# Patient Record
Sex: Female | Born: 1935 | Race: Black or African American | Hispanic: No | Smoking: Former smoker
Health system: Southern US, Community
[De-identification: ages and names within clinical notes are randomized; demographics above are authoritative.]

## PROBLEM LIST (undated history)

## (undated) DIAGNOSIS — E559 Vitamin D deficiency, unspecified: Secondary | ICD-10-CM

## (undated) DIAGNOSIS — N182 Chronic kidney disease, stage 2 (mild): Secondary | ICD-10-CM

## (undated) DIAGNOSIS — R413 Other amnesia: Secondary | ICD-10-CM

## (undated) DIAGNOSIS — E785 Hyperlipidemia, unspecified: Secondary | ICD-10-CM

## (undated) DIAGNOSIS — I10 Essential (primary) hypertension: Secondary | ICD-10-CM

## (undated) HISTORY — PX: BREAST EXCISIONAL BIOPSY: SUR124

## (undated) HISTORY — DX: Hyperlipidemia, unspecified: E78.5

## (undated) HISTORY — PX: BREAST SURGERY: SHX581

## (undated) HISTORY — DX: Chronic kidney disease, stage 2 (mild): N18.2

## (undated) HISTORY — PX: ANKLE FRACTURE SURGERY: SHX122

## (undated) HISTORY — DX: Vitamin D deficiency, unspecified: E55.9

## (undated) HISTORY — DX: Other amnesia: R41.3

## (undated) HISTORY — PX: KNEE SURGERY: SHX244

---

## 1998-04-11 ENCOUNTER — Emergency Department (HOSPITAL_COMMUNITY): Admission: EM | Admit: 1998-04-11 | Discharge: 1998-04-11 | Payer: Self-pay | Admitting: Emergency Medicine

## 1999-10-14 ENCOUNTER — Emergency Department (HOSPITAL_COMMUNITY): Admission: EM | Admit: 1999-10-14 | Discharge: 1999-10-14 | Payer: Self-pay | Admitting: *Deleted

## 2000-03-29 ENCOUNTER — Emergency Department (HOSPITAL_COMMUNITY): Admission: EM | Admit: 2000-03-29 | Discharge: 2000-03-30 | Payer: Self-pay | Admitting: Emergency Medicine

## 2000-03-31 ENCOUNTER — Encounter: Admission: RE | Admit: 2000-03-31 | Discharge: 2000-03-31 | Payer: Self-pay | Admitting: General Surgery

## 2000-03-31 ENCOUNTER — Encounter (HOSPITAL_BASED_OUTPATIENT_CLINIC_OR_DEPARTMENT_OTHER): Payer: Self-pay | Admitting: General Surgery

## 2002-04-05 ENCOUNTER — Encounter (HOSPITAL_BASED_OUTPATIENT_CLINIC_OR_DEPARTMENT_OTHER): Payer: Self-pay | Admitting: General Surgery

## 2002-04-05 ENCOUNTER — Encounter: Admission: RE | Admit: 2002-04-05 | Discharge: 2002-04-05 | Payer: Self-pay | Admitting: General Surgery

## 2003-04-26 ENCOUNTER — Encounter (HOSPITAL_BASED_OUTPATIENT_CLINIC_OR_DEPARTMENT_OTHER): Payer: Self-pay | Admitting: General Surgery

## 2003-04-26 ENCOUNTER — Encounter: Admission: RE | Admit: 2003-04-26 | Discharge: 2003-04-26 | Payer: Self-pay | Admitting: General Surgery

## 2004-06-20 ENCOUNTER — Encounter: Admission: RE | Admit: 2004-06-20 | Discharge: 2004-06-20 | Payer: Self-pay | Admitting: General Surgery

## 2008-11-05 ENCOUNTER — Inpatient Hospital Stay (HOSPITAL_COMMUNITY): Admission: EM | Admit: 2008-11-05 | Discharge: 2008-11-14 | Payer: Self-pay | Admitting: Emergency Medicine

## 2010-03-12 ENCOUNTER — Emergency Department (HOSPITAL_COMMUNITY): Admission: EM | Admit: 2010-03-12 | Discharge: 2010-03-12 | Payer: Self-pay | Admitting: Family Medicine

## 2010-03-14 ENCOUNTER — Emergency Department (HOSPITAL_COMMUNITY): Admission: EM | Admit: 2010-03-14 | Discharge: 2010-03-14 | Payer: Self-pay | Admitting: Family Medicine

## 2010-12-19 LAB — PROTIME-INR
INR: 1.2 (ref 0.00–1.49)
INR: 1.6 — ABNORMAL HIGH (ref 0.00–1.49)
INR: 1.9 — ABNORMAL HIGH (ref 0.00–1.49)
INR: 2.1 — ABNORMAL HIGH (ref 0.00–1.49)
INR: 2.2 — ABNORMAL HIGH (ref 0.00–1.49)
INR: 2.4 — ABNORMAL HIGH (ref 0.00–1.49)
Prothrombin Time: 15.2 seconds (ref 11.6–15.2)
Prothrombin Time: 19.9 seconds — ABNORMAL HIGH (ref 11.6–15.2)
Prothrombin Time: 22.4 seconds — ABNORMAL HIGH (ref 11.6–15.2)
Prothrombin Time: 25.1 seconds — ABNORMAL HIGH (ref 11.6–15.2)
Prothrombin Time: 30.2 seconds — ABNORMAL HIGH (ref 11.6–15.2)

## 2010-12-19 LAB — CBC
RBC: 4.36 MIL/uL (ref 3.87–5.11)
WBC: 6.9 10*3/uL (ref 4.0–10.5)

## 2010-12-19 LAB — HEMOGLOBIN AND HEMATOCRIT, BLOOD: Hemoglobin: 11.1 g/dL — ABNORMAL LOW (ref 12.0–15.0)

## 2010-12-24 LAB — SAMPLE TO BLOOD BANK

## 2010-12-24 LAB — POCT I-STAT, CHEM 8
Calcium, Ion: 1.09 mmol/L — ABNORMAL LOW (ref 1.12–1.32)
Chloride: 111 mEq/L (ref 96–112)
HCT: 46 % (ref 36.0–46.0)
Hemoglobin: 15.6 g/dL — ABNORMAL HIGH (ref 12.0–15.0)

## 2010-12-24 LAB — PROTIME-INR: INR: 1.1 (ref 0.00–1.49)

## 2010-12-24 LAB — CBC
HCT: 42.8 % (ref 36.0–46.0)
Hemoglobin: 14.5 g/dL (ref 12.0–15.0)
Platelets: 186 10*3/uL (ref 150–400)
WBC: 6.3 10*3/uL (ref 4.0–10.5)

## 2010-12-26 ENCOUNTER — Ambulatory Visit (INDEPENDENT_AMBULATORY_CARE_PROVIDER_SITE_OTHER): Payer: PRIVATE HEALTH INSURANCE

## 2010-12-26 ENCOUNTER — Inpatient Hospital Stay (INDEPENDENT_AMBULATORY_CARE_PROVIDER_SITE_OTHER)
Admission: RE | Admit: 2010-12-26 | Discharge: 2010-12-26 | Disposition: A | Discharge status: Home / Self Care | Payer: Medicare Other | Source: Ambulatory Visit | Attending: Family Medicine | Admitting: Family Medicine

## 2010-12-26 DIAGNOSIS — I10 Essential (primary) hypertension: Secondary | ICD-10-CM

## 2010-12-26 DIAGNOSIS — J189 Pneumonia, unspecified organism: Secondary | ICD-10-CM

## 2010-12-26 DIAGNOSIS — H6120 Impacted cerumen, unspecified ear: Secondary | ICD-10-CM

## 2010-12-26 LAB — POCT I-STAT, CHEM 8
HCT: 39 % (ref 36.0–46.0)
Hemoglobin: 13.3 g/dL (ref 12.0–15.0)
Potassium: 3.9 mEq/L (ref 3.5–5.1)
Sodium: 142 mEq/L (ref 135–145)

## 2010-12-26 LAB — POCT URINALYSIS DIP (DEVICE)
Glucose, UA: NEGATIVE mg/dL
Nitrite: NEGATIVE
Specific Gravity, Urine: 1.015 (ref 1.005–1.030)
Urobilinogen, UA: 2 mg/dL — ABNORMAL HIGH (ref 0.0–1.0)

## 2010-12-30 ENCOUNTER — Inpatient Hospital Stay (INDEPENDENT_AMBULATORY_CARE_PROVIDER_SITE_OTHER)
Admission: RE | Admit: 2010-12-30 | Discharge: 2010-12-30 | Disposition: A | Discharge status: Home / Self Care | Payer: PRIVATE HEALTH INSURANCE | Source: Ambulatory Visit | Attending: Family Medicine | Admitting: Family Medicine

## 2010-12-30 DIAGNOSIS — H6120 Impacted cerumen, unspecified ear: Secondary | ICD-10-CM

## 2010-12-30 DIAGNOSIS — J189 Pneumonia, unspecified organism: Secondary | ICD-10-CM
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
## 2012-09-08 DIAGNOSIS — J189 Pneumonia, unspecified organism: Secondary | ICD-10-CM

## 2012-09-08 HISTORY — DX: Pneumonia, unspecified organism: J18.9

## 2016-09-08 DIAGNOSIS — D249 Benign neoplasm of unspecified breast: Secondary | ICD-10-CM

## 2016-09-08 HISTORY — DX: Benign neoplasm of unspecified breast: D24.9

## 2016-09-08 HISTORY — PX: BREAST SURGERY: SHX581

## 2017-03-28 ENCOUNTER — Emergency Department (HOSPITAL_COMMUNITY)
Admission: EM | Admit: 2017-03-28 | Discharge: 2017-03-28 | Disposition: A | Discharge status: Home / Self Care | Payer: Medicare Other | Attending: Emergency Medicine | Admitting: Emergency Medicine

## 2017-03-28 ENCOUNTER — Encounter (HOSPITAL_COMMUNITY): Payer: Self-pay | Admitting: Emergency Medicine

## 2017-03-28 DIAGNOSIS — N644 Mastodynia: Secondary | ICD-10-CM | POA: Diagnosis not present

## 2017-03-28 MED ORDER — CEPHALEXIN 500 MG PO CAPS: 500.0000 mg | ORAL_CAPSULE | Freq: Four times a day (QID) | ORAL | 0 refills | Status: DC

## 2017-03-28 MED ORDER — HYDROCODONE-ACETAMINOPHEN 5-325 MG PO TABS: 1.0000 | ORAL_TABLET | ORAL | 0 refills | Status: DC | PRN

## 2017-03-28 MED ORDER — IBUPROFEN 200 MG PO TABS
400.0000 mg | ORAL_TABLET | Freq: Once | ORAL | Status: AC
  Administered 2017-03-28: 400 mg via ORAL
  Filled 2017-03-28: qty 2

## 2017-03-28 MED ORDER — OXYCODONE-ACETAMINOPHEN 5-325 MG PO TABS
1.0000 | ORAL_TABLET | Freq: Once | ORAL | Status: AC
  Administered 2017-03-28: 1 via ORAL
  Filled 2017-03-28: qty 1

## 2017-03-28 MED ORDER — CEPHALEXIN 500 MG PO CAPS
500.0000 mg | ORAL_CAPSULE | Freq: Once | ORAL | Status: AC
  Administered 2017-03-28: 500 mg via ORAL
  Filled 2017-03-28: qty 1

## 2017-03-28 NOTE — ED Triage Notes (Signed)
Pt from home with complaints of left breast swelling and pain that began around 1600 today. Pt states she had the same thing happen "many" years ago and they ended up doing a biopsy in her PCP's office.

## 2017-03-28 NOTE — ED Provider Notes (Signed)
[facility] DEPT Provider Note   CSN: [id number] Arrival date & time: 03/28/17  1947   By signing my name below, I, Soijett Blue, attest that this documentation has been prepared under the direction and in the presence of Virgel Manifold, MD. Electronically Signed: [place], ED Scribe. 03/28/17. 8:27 PM.  History   Chief Complaint Chief Complaint  Patient presents with  . Breast Pain    HPI Abigail Shannon is a 81 y.o. female who presents to the Emergency Department complaining of left breast pain onset 4 PM today. Pt reports associated swelling localized to left breast. Pt has not tried any medications for the relief of her symptoms. She notes that she has a hx of mastitis to her left breast s/p breastfeeding her daughter where the affected area was biopsied by her PCP. Pt states that she is unsure of when the left breast was biopsied. Pt reports that she is unsure of the last time that she had a mammogram. She denies rash, redness, fever, chills, nipple discharge, and any other symptoms. Denies allergies to medications.  The history is provided by the patient. No language interpreter was used.    History reviewed. No pertinent past medical history.  There are no active problems to display for this patient.   Past Surgical History:  Procedure Laterality Date  . BREAST SURGERY    . KNEE SURGERY      OB History    No data available       Home Medications    Prior to Admission medications   Not on File    Family History No family history on file.  Social History Social History  Substance Use Topics  . Smoking status: Never Smoker  . Smokeless tobacco: Never Used  . Alcohol use No     Allergies   Aspirin   Review of Systems Review of Systems  Constitutional: Negative for chills and fever.  Respiratory:       +left breast pain and swelling  Skin: Negative for color change and rash.     Physical Exam Updated Vital Signs BP (!) 208/86 (BP  Location: Right Arm) Comment: Pt's bp is usually under control with medications  Pulse 62   Temp 97.7 F (36.5 C) (Oral)   Resp 16   Ht 5\' 4"  (1.626 m)   Wt 150 lb (68 kg)   SpO2 100%   BMI 25.75 kg/m   Physical Exam  Constitutional: She is oriented to person, place, and time. She appears well-developed and well-nourished. No distress.  HENT:  Head: Normocephalic and atraumatic.  Eyes: EOM are normal.  Neck: Neck supple.  Cardiovascular: Normal rate, regular rhythm and normal heart sounds.  Exam reveals no gallop and no friction rub.   No murmur heard. Pulmonary/Chest: Effort normal and breath sounds normal. No respiratory distress. She has no wheezes. She has no rales. Left breast exhibits tenderness. Left breast exhibits no inverted nipple and no nipple discharge.  Scribe chaperone present for exam. Left breast with TTP. Faint erythema to the upper inner quadrant of the left breast. Small, fibrous, area subcutaneously. No fluctuance. No skin thickening. Nipple appears normal. No retraction or discharge. No axillary adenopathy.  Abdominal: Soft. She exhibits no distension. There is no tenderness.  Musculoskeletal: Normal range of motion.  Lymphadenopathy:    She has no axillary adenopathy.  Neurological: She is alert and oriented to person, place, and time.  Skin: Skin is warm and dry.  Psychiatric: She has a normal  mood and affect. Her behavior is normal.  Nursing note and vitals reviewed.   ED Treatments / Results  DIAGNOSTIC STUDIES: Oxygen Saturation is 100% on RA, nl by my interpretation.    COORDINATION OF CARE: 8:24 PM Discussed treatment plan with pt at bedside and pt agreed to plan.   Labs (all labs ordered are listed, but only abnormal results are displayed) Labs Reviewed - No data to display  EKG  EKG Interpretation None       Radiology No results found.  Procedures Procedures (including critical care time)  Medications Ordered in ED Medications -  No data to display   Initial Impression / Assessment and Plan / ED Course  I have reviewed the triage vital signs and the nursing notes.  Pertinent labs & imaging results that were available during my care of the patient were reviewed by me and considered in my medical decision making (see chart for details).     81yF with breast pain. Small mass versus abscess. Nontoxic. No overlying skin changes. Keflex. Needs further imaging. Recommended FU at [facility].   Final Clinical Impressions(s) / ED Diagnoses   Final diagnoses:  Breast pain, left    New Prescriptions New Prescriptions   No medications on file   I personally preformed the services scribed in my presence. The recorded information has been reviewed is accurate. Virgel Manifold, MD.     Virgel Manifold, MD 04/12/17 Natasha Mead

## 2017-03-28 NOTE — Discharge Instructions (Signed)
I am concerned you are developing an infection in your breast. Take antibiotics as prescribed and pain medication as needed. You need to follow-up at the [facility] for imaging.

## 2017-03-31 ENCOUNTER — Emergency Department (HOSPITAL_COMMUNITY)
Admission: EM | Admit: 2017-03-31 | Discharge: 2017-03-31 | Disposition: A | Discharge status: Home / Self Care | Payer: Medicare Other | Attending: Emergency Medicine | Admitting: Emergency Medicine

## 2017-03-31 ENCOUNTER — Encounter (HOSPITAL_COMMUNITY): Payer: Self-pay | Admitting: Emergency Medicine

## 2017-03-31 DIAGNOSIS — N6342 Unspecified lump in left breast, subareolar: Secondary | ICD-10-CM | POA: Diagnosis not present

## 2017-03-31 DIAGNOSIS — N6320 Unspecified lump in the left breast, unspecified quadrant: Secondary | ICD-10-CM | POA: Diagnosis present

## 2017-03-31 DIAGNOSIS — N63 Unspecified lump in unspecified breast: Secondary | ICD-10-CM

## 2017-03-31 DIAGNOSIS — I10 Essential (primary) hypertension: Secondary | ICD-10-CM | POA: Insufficient documentation

## 2017-03-31 DIAGNOSIS — R51 Headache: Secondary | ICD-10-CM | POA: Diagnosis not present

## 2017-03-31 LAB — CBC WITH DIFFERENTIAL/PLATELET
Basophils Absolute: 0 10*3/uL (ref 0.0–0.1)
Basophils Relative: 1 %
EOS PCT: 3 %
Eosinophils Absolute: 0.2 10*3/uL (ref 0.0–0.7)
HCT: 36.7 % (ref 36.0–46.0)
Hemoglobin: 12.3 g/dL (ref 12.0–15.0)
LYMPHS ABS: 2.4 10*3/uL (ref 0.7–4.0)
LYMPHS PCT: 41 %
MCH: 28.3 pg (ref 26.0–34.0)
MCHC: 33.5 g/dL (ref 30.0–36.0)
MCV: 84.4 fL (ref 78.0–100.0)
MONO ABS: 0.6 10*3/uL (ref 0.1–1.0)
Monocytes Relative: 9 %
Neutro Abs: 2.8 10*3/uL (ref 1.7–7.7)
Neutrophils Relative %: 46 %
PLATELETS: 164 10*3/uL (ref 150–400)
RBC: 4.35 MIL/uL (ref 3.87–5.11)
RDW: 15.2 % (ref 11.5–15.5)
WBC: 6 10*3/uL (ref 4.0–10.5)

## 2017-03-31 LAB — COMPREHENSIVE METABOLIC PANEL
ALT: 10 U/L — ABNORMAL LOW (ref 14–54)
AST: 18 U/L (ref 15–41)
Albumin: 3.9 g/dL (ref 3.5–5.0)
Alkaline Phosphatase: 74 U/L (ref 38–126)
Anion gap: 9 (ref 5–15)
BUN: 17 mg/dL (ref 6–20)
CHLORIDE: 107 mmol/L (ref 101–111)
CO2: 26 mmol/L (ref 22–32)
CREATININE: 1.21 mg/dL — AB (ref 0.44–1.00)
Calcium: 8.9 mg/dL (ref 8.9–10.3)
GFR, EST AFRICAN AMERICAN: 48 mL/min — AB (ref >60)
GFR, EST NON AFRICAN AMERICAN: 41 mL/min — AB (ref >60)
Glucose, Bld: 95 mg/dL (ref 65–99)
POTASSIUM: 4.2 mmol/L (ref 3.5–5.1)
Sodium: 142 mmol/L (ref 135–145)
Total Bilirubin: 0.6 mg/dL (ref 0.3–1.2)
Total Protein: 7.6 g/dL (ref 6.5–8.1)

## 2017-03-31 MED ORDER — HYDRALAZINE HCL 20 MG/ML IJ SOLN
10.0000 mg | Freq: Once | INTRAMUSCULAR | Status: AC
  Administered 2017-03-31: 10 mg via INTRAVENOUS
  Filled 2017-03-31: qty 0.5

## 2017-03-31 MED ORDER — AMLODIPINE BESYLATE 5 MG PO TABS
10.0000 mg | ORAL_TABLET | Freq: Once | ORAL | Status: AC
  Administered 2017-03-31: 10 mg via ORAL
  Filled 2017-03-31: qty 2

## 2017-03-31 MED ORDER — AMLODIPINE BESYLATE 10 MG PO TABS: 10.0000 mg | ORAL_TABLET | Freq: Every day | ORAL | 0 refills | Status: DC

## 2017-03-31 MED ORDER — DOXYCYCLINE HYCLATE 100 MG PO CAPS: 100.0000 mg | ORAL_CAPSULE | Freq: Two times a day (BID) | ORAL | 0 refills | Status: AC

## 2017-03-31 MED ORDER — LOSARTAN POTASSIUM 25 MG PO TABS
25.0000 mg | ORAL_TABLET | Freq: Once | ORAL | Status: AC
  Administered 2017-03-31: 25 mg via ORAL
  Filled 2017-03-31: qty 1

## 2017-03-31 NOTE — ED Provider Notes (Signed)
[facility] DEPT Provider Note   CSN: [id number] Arrival date & time: 03/31/17  1452     History   Chief Complaint Chief Complaint  Patient presents with  . Breast Mass    left     HPI Abigail Shannon is a 81 y.o. female.  HPI   Presents with left breast pain and since Saturday> Was seen in the ED, started on keflex but has not had relief.  Has been getting worse since Saturday. Then reported pain in breast was causing headache.  Has been taking blood pressure medication as prescribed. BP elevated in ED. Denies chest pain, dyspnea, numbness/weakness, facial droop, trouble talking or walking.  Did have headache earlier but relieved now.  Pain in breast was 12/10, no drainage.   History of breast mass, but no history of abscess or skin infection. This feels similar to when had breast surgery 30yr ago.   History reviewed. No pertinent past medical history.  There are no active problems to display for this patient.   Past Surgical History:  Procedure Laterality Date  . BREAST SURGERY    . KNEE SURGERY      OB History    No data available       Home Medications    Prior to Admission medications   Medication Sig Start Date End Date Taking? Authorizing Provider  cephALEXin (KEFLEX) 500 MG capsule Take 1 capsule (500 mg total) by mouth 4 (four) times daily. 03/28/17  Yes Virgel Manifold, MD  HYDROcodone-acetaminophen (NORCO/VICODIN) 5-325 MG tablet Take 1 tablet by mouth every 4 (four) hours as needed. 03/28/17  Yes Virgel Manifold, MD  losartan (COZAAR) 100 MG tablet Take 100 mg by mouth daily.   Yes [provider]  naproxen sodium (ANAPROX) 220 MG tablet Take 220 mg by mouth every 8 (eight) hours as needed (knee pain).   Yes [provider]  amLODipine (NORVASC) 10 MG tablet Take 1 tablet (10 mg total) by mouth daily. 03/31/17   Gareth Morgan, MD  doxycycline (VIBRAMYCIN) 100 MG capsule Take 1 capsule (100 mg total) by mouth 2 (two) times daily.  03/31/17 04/07/17  Gareth Morgan, MD    Family History No family history on file.  Social History Social History  Substance Use Topics  . Smoking status: Never Smoker  . Smokeless tobacco: Never Used  . Alcohol use No     Allergies   Aspirin   Review of Systems Review of Systems  Constitutional: Negative for fever.  HENT: Negative for sore throat.   Eyes: Negative for visual disturbance.  Respiratory: Negative for cough and shortness of breath.   Cardiovascular: Positive for chest pain (breast pain).  Gastrointestinal: Negative for abdominal pain, nausea and vomiting.  Genitourinary: Negative for difficulty urinating.  Musculoskeletal: Negative for back pain and neck pain.  Skin: Negative for rash.  Neurological: Positive for headaches. Negative for syncope, facial asymmetry, speech difficulty, weakness and numbness.     Physical Exam Updated Vital Signs BP (!) 172/77   Pulse 79   Temp 98.5 F (36.9 C) (Oral)   Resp 18   Ht 5\' 4"  (1.626 m)   Wt 68 kg (150 lb)   SpO2 100%   BMI 25.75 kg/m   Physical Exam  Constitutional: She is oriented to person, place, and time. She appears well-developed and well-nourished. No distress.  HENT:  Head: Normocephalic.  Eyes: Pupils are equal, round, and reactive to light. Conjunctivae and EOM are normal.  Neck: Normal range of motion.  Cardiovascular: Normal rate, regular rhythm, normal heart sounds and intact distal pulses.  Exam reveals no gallop and no friction rub.   No murmur heard. Pulmonary/Chest: Effort normal and breath sounds normal. No respiratory distress. She has no wheezes. She has no rales.  Left breast with tender mass measuring approximately 3cm on palpation under areola. No erythema. No drainage.  Abdominal: Soft. She exhibits no distension. There is no tenderness. There is no guarding.  Musculoskeletal: She exhibits no edema or tenderness.  Neurological: She is alert and oriented to person, place, and  time.  Skin: Skin is warm and dry. No rash noted. She is not diaphoretic. No erythema.  Nursing note and vitals reviewed.    ED Treatments / Results  Labs (all labs ordered are listed, but only abnormal results are displayed) Labs Reviewed  COMPREHENSIVE METABOLIC PANEL - Abnormal; Notable for the following:       Result Value   Creatinine, Ser 1.21 (*)    ALT 10 (*)    GFR calc non Af Amer 41 (*)    GFR calc Af Amer 48 (*)    All other components within normal limits  CBC WITH DIFFERENTIAL/PLATELET    EKG  EKG Interpretation None       Radiology No results found.  Procedures Procedures (including critical care time)  Medications Ordered in ED Medications  hydrALAZINE (APRESOLINE) injection 10 mg (10 mg Intravenous Given 03/31/17 1923)  losartan (COZAAR) tablet 25 mg (25 mg Oral Given 03/31/17 1928)  amLODipine (NORVASC) tablet 10 mg (10 mg Oral Given 03/31/17 2018)  hydrALAZINE (APRESOLINE) injection 10 mg (10 mg Intravenous Given 03/31/17 2017)     Initial Impression / Assessment and Plan / ED Course  I have reviewed the triage vital signs and the nursing notes.  Pertinent labs & imaging results that were available during my care of the patient were reviewed by me and considered in my medical decision making (see chart for details).     81 year old female with a history of hypertension, prior breast surgery, presents with concern for breast mass. Patient had been evaluated on Saturday for the same, started on Keflex, however has had increasing symptoms. Patient noted to be hypertensive on arrival to the emergency department to 240s over 120s.  Patient without acute headache, no neurologic symptoms, no chest pain, no shortness of breath and have low suspicion for hypertensive emergencies including low suspicion for [facility] hypertensive encephalopathy, stroke, MI, aortic dissection, pulmonary edema.  BMP was checked showing renal function with cr of 1.2 from 1 in 2012.  Provided patient with dose of losartan and given hydralazine x2, then amlodipine.  Blood pressures improved to 761Y systolic. Discussed importance of close primary care follow up and reasons to return to the ED in detail.  For breast mass, unclear if abscess, area is tender, however not erythematous, no leukocytosis, will change abx to doxycycline given worsening pain for MRSA coverage, however patient stable, not clearly an abscess, do not feel inpatient admission or emergent drainage.  Recommend Follow-up with breast care center as well as primary care physician for hypertension.  Patient discharged in stable condition with understanding of reasons to return.    Final Clinical Impressions(s) / ED Diagnoses   Final diagnoses:  Breast mass  Essential hypertension    New Prescriptions Discharge Medication List as of 03/31/2017  9:38 PM    START taking these medications   Details  amLODipine (NORVASC) 10 MG tablet Take 1 tablet (10 mg total) by  mouth daily., Starting Tue 03/31/2017, Print         Gareth Morgan, MD 04/01/17 [phone]

## 2017-03-31 NOTE — ED Triage Notes (Signed)
Pt here with c/o left breast swelling and pain that she rates 10/10. Pt was recently seen for the same and states symptoms have gotten worse. Pt is hypertensive at time of assessment, pt denies lightheadedness but states she has a headache. Pt denies n/v.

## 2017-03-31 NOTE — ED Notes (Signed)
Pt called by Larene Pickett, EMT and this writer without response from lobby.

## 2017-03-31 NOTE — ED Notes (Signed)
Bed: [facility] Expected date:  Expected time:  Means of arrival:  Comments: 

## 2017-04-02 ENCOUNTER — Other Ambulatory Visit: Payer: Self-pay | Admitting: Internal Medicine

## 2017-04-02 DIAGNOSIS — N63 Unspecified lump in unspecified breast: Secondary | ICD-10-CM

## 2017-04-07 ENCOUNTER — Ambulatory Visit
Admission: RE | Admit: 2017-04-07 | Discharge: 2017-04-07 | Disposition: A | Discharge status: Home / Self Care | Payer: Medicare Other | Source: Ambulatory Visit | Attending: Internal Medicine | Admitting: Internal Medicine

## 2017-04-07 ENCOUNTER — Other Ambulatory Visit: Payer: Self-pay | Admitting: Internal Medicine

## 2017-04-07 DIAGNOSIS — N63 Unspecified lump in unspecified breast: Secondary | ICD-10-CM

## 2017-04-10 ENCOUNTER — Ambulatory Visit
Admission: RE | Admit: 2017-04-10 | Discharge: 2017-04-10 | Disposition: A | Discharge status: Home / Self Care | Payer: Medicare Other | Source: Ambulatory Visit | Attending: Internal Medicine | Admitting: Internal Medicine

## 2017-04-10 ENCOUNTER — Other Ambulatory Visit: Payer: Self-pay | Admitting: Internal Medicine

## 2017-04-10 DIAGNOSIS — N63 Unspecified lump in unspecified breast: Secondary | ICD-10-CM

## 2017-04-10 DIAGNOSIS — R928 Other abnormal and inconclusive findings on diagnostic imaging of breast: Secondary | ICD-10-CM

## 2017-04-18 ENCOUNTER — Encounter (HOSPITAL_COMMUNITY): Payer: Self-pay | Admitting: Emergency Medicine

## 2017-04-18 ENCOUNTER — Emergency Department (HOSPITAL_COMMUNITY): Payer: Medicare Other

## 2017-04-18 ENCOUNTER — Emergency Department (HOSPITAL_COMMUNITY)
Admission: EM | Admit: 2017-04-18 | Discharge: 2017-04-18 | Disposition: A | Discharge status: Home / Self Care | Payer: Medicare Other | Attending: Emergency Medicine | Admitting: Emergency Medicine

## 2017-04-18 DIAGNOSIS — I10 Essential (primary) hypertension: Secondary | ICD-10-CM | POA: Diagnosis not present

## 2017-04-18 DIAGNOSIS — Y998 Other external cause status: Secondary | ICD-10-CM | POA: Diagnosis not present

## 2017-04-18 DIAGNOSIS — Z79899 Other long term (current) drug therapy: Secondary | ICD-10-CM | POA: Insufficient documentation

## 2017-04-18 DIAGNOSIS — W1840XA Slipping, tripping and stumbling without falling, unspecified, initial encounter: Secondary | ICD-10-CM | POA: Insufficient documentation

## 2017-04-18 DIAGNOSIS — Z23 Encounter for immunization: Secondary | ICD-10-CM | POA: Diagnosis not present

## 2017-04-18 DIAGNOSIS — Y92830 Public park as the place of occurrence of the external cause: Secondary | ICD-10-CM | POA: Diagnosis not present

## 2017-04-18 DIAGNOSIS — S0181XA Laceration without foreign body of other part of head, initial encounter: Secondary | ICD-10-CM | POA: Diagnosis present

## 2017-04-18 DIAGNOSIS — W19XXXA Unspecified fall, initial encounter: Secondary | ICD-10-CM

## 2017-04-18 DIAGNOSIS — Y9301 Activity, walking, marching and hiking: Secondary | ICD-10-CM | POA: Insufficient documentation

## 2017-04-18 HISTORY — DX: Essential (primary) hypertension: I10

## 2017-04-18 MED ORDER — TETANUS-DIPHTH-ACELL PERTUSSIS 5-2.5-18.5 LF-MCG/0.5 IM SUSP
0.5000 mL | Freq: Once | INTRAMUSCULAR | Status: AC
  Administered 2017-04-18: 0.5 mL via INTRAMUSCULAR
  Filled 2017-04-18: qty 0.5

## 2017-04-18 MED ORDER — LIDOCAINE-EPINEPHRINE (PF) 2 %-1:200000 IJ SOLN
10.0000 mL | Freq: Once | INTRAMUSCULAR | Status: AC
  Administered 2017-04-18: 10 mL
  Filled 2017-04-18: qty 20

## 2017-04-18 NOTE — ED Triage Notes (Signed)
Pt [place] laceration [place]. Denies LOC, A&O x 4.

## 2017-04-18 NOTE — ED Notes (Signed)
ED Provider at bedside giving discharge instructions. Pt voiced understanding of discharge instructions.

## 2017-04-18 NOTE — ED Notes (Signed)
EDP aware pt is hypertensive. Pt is asymptomatic. Pt given instruction by MD to take BP meds at home.

## 2017-04-18 NOTE — ED Provider Notes (Signed)
[facility] DEPT Provider Note   CSN: [id number] Arrival date & time: 04/18/17  1515     History   Chief Complaint Chief Complaint  Patient presents with  . Laceration    HPI Abigail Shannon is a 81 y.o. female with a history of HTN who presents after a Mechanical fall in the park causing her to strike her head on the ground. She reportedly was getting water from a cooler and tripped which caused her to fall. She broke her glasses when she fell. Her family members, who witnessed the fall, deny any loss of consciousness.  She denies any neck pain, headache, blurry/double vision, no extremity pain.  She was well prior to the fall.  She is unsure when her last tetanus shot was.  She is complaining of a cut to the left side of her face lateral to the eye.  She denies any current chest pain, shortness of breath, abdominal pain, injuries other than the cuts by her eye.   HPI  Past Medical History:  Diagnosis Date  . Hypertension     There are no active problems to display for this patient.   Past Surgical History:  Procedure Laterality Date  . BREAST EXCISIONAL BIOPSY Left    2 benign breast biopsies years ago  . BREAST SURGERY    . KNEE SURGERY      OB History    No data available       Home Medications    Prior to Admission medications   Medication Sig Start Date End Date Taking? Authorizing Provider  amLODipine (NORVASC) 10 MG tablet Take 1 tablet (10 mg total) by mouth daily. 03/31/17   Gareth Morgan, MD  cephALEXin (KEFLEX) 500 MG capsule Take 1 capsule (500 mg total) by mouth 4 (four) times daily. 03/28/17   Virgel Manifold, MD  HYDROcodone-acetaminophen (NORCO/VICODIN) 5-325 MG tablet Take 1 tablet by mouth every 4 (four) hours as needed. 03/28/17   Virgel Manifold, MD  losartan (COZAAR) 100 MG tablet Take 100 mg by mouth daily.    [provider]  naproxen sodium (ANAPROX) 220 MG tablet Take 220 mg by mouth every 8 (eight) hours as needed (knee pain).     [provider]    Family History No family history on file.  Social History Social History  Substance Use Topics  . Smoking status: Never Smoker  . Smokeless tobacco: Never Used  . Alcohol use No     Allergies   Aspirin   Review of Systems Review of Systems  Constitutional: Negative for chills and fever.  HENT: Negative for ear pain and sore throat.   Eyes: Negative for pain and visual disturbance.  Respiratory: Negative for cough and shortness of breath.   Cardiovascular: Negative for chest pain and palpitations.  Gastrointestinal: Negative for abdominal pain and vomiting.  Genitourinary: Negative for dysuria and hematuria.  Musculoskeletal: Negative for arthralgias, back pain, gait problem, neck pain and neck stiffness.  Skin: Positive for wound. Negative for color change and rash.  Neurological: Negative for dizziness, seizures, syncope, weakness, light-headedness and headaches.  All other systems reviewed and are negative.    Physical Exam Updated Vital Signs BP (!) 197/92   Pulse 65   Temp 98.2 F (36.8 C) (Oral)   Resp 18   Ht 5\' 4"  (1.626 m)   Wt 63.5 kg (140 lb)   SpO2 98%   BMI 24.03 kg/m   Physical Exam  Constitutional: She is oriented to person, place, and  time. She appears well-developed and well-nourished. No distress.  HENT:  Head: Normocephalic and atraumatic.  Mouth/Throat: Oropharynx is clear and moist.  Eyes: Conjunctivae are normal.  Neck: Normal range of motion. Neck supple.  Cardiovascular: Normal rate, regular rhythm, normal heart sounds and intact distal pulses.   No murmur heard. Pulmonary/Chest: Effort normal and breath sounds normal. No respiratory distress.  Abdominal: Soft. She exhibits no distension. There is no tenderness.  Musculoskeletal: She exhibits no edema.  Neurological: She is alert and oriented to person, place, and time.  Mental Status:  Alert, oriented, thought content appropriate, able to give a coherent  history. Speech fluent without evidence of aphasia. Able to follow 2 step commands without difficulty.  Cranial Nerves:  II:  Peripheral visual fields grossly normal, pupils equal, round, reactive to light III,IV, VI: ptosis not present, extra-ocular motions intact bilaterally  V,VII: smile symmetric, facial light touch sensation equal VIII: hearing grossly normal to voice  X: uvula elevates symmetrically  XI: bilateral shoulder shrug symmetric and strong XII: midline tongue extension without fassiculations Motor:  Normal tone. 5/5 in upper and lower extremities bilaterally including strong and equal grip strength and dorsiflexion/plantar flexion CV: distal pulses palpable throughout    Skin: Skin is warm and dry.  1cm superficial laceration, not through dermis, lateral to left eye.  1cm lateral there is a second superficial stellate laceration with a under 36mm area that is into the subcutaneous tissue.    Psychiatric: She has a normal mood and affect. Her behavior is normal.  Nursing note and vitals reviewed.    ED Treatments / Results  Labs (all labs ordered are listed, but only abnormal results are displayed) Labs Reviewed - No data to display  EKG  EKG Interpretation None       Radiology Ct Head Wo Contrast  Result Date: 04/18/2017 CLINICAL DATA:  Head trauma.  Fall EXAM: CT HEAD WITHOUT CONTRAST TECHNIQUE: Contiguous axial images were obtained from the base of the skull through the vertex without intravenous contrast. COMPARISON:  11/09/2008 FINDINGS: Brain: Mild atrophy. Negative for hydrocephalus. Negative for acute infarct, hemorrhage, or mass lesion. No shift of the midline structures Vascular: Negative for hyperdense vessel Skull: Negative for skull fracture Sinuses/Orbits: Negative Other: None IMPRESSION: No acute abnormality.  Mild atrophy. Electronically Signed   By: Franchot Gallo M.D.   On: 04/18/2017 16:54    Procedures .Marland KitchenLaceration Repair Date/Time: 04/18/2017  8:00 PM Performed by: Lorin Glass Authorized by: Lorin Glass   Consent:    Consent obtained:  Verbal   Consent given by:  Patient   Risks discussed:  Pain, infection, need for additional repair, poor cosmetic result, retained foreign body, tendon damage, vascular damage and nerve damage   Alternatives discussed:  No treatment (Steri-strip, tissue adhesive) Anesthesia (see MAR for exact dosages):    Anesthesia method:  Local infiltration   Local anesthetic:  Lidocaine 2% WITH epi Laceration details:    Location:  Face (Stellate)   Face location:  L cheek   Length (cm):  1 Repair type:    Repair type:  Simple Pre-procedure details:    Preparation:  Patient was prepped and draped in usual sterile fashion and imaging obtained to evaluate for foreign bodies Exploration:    Hemostasis achieved with:  Direct pressure and epinephrine   Wound exploration: wound explored through full range of motion and entire depth of wound probed and visualized   Treatment:    Area cleansed with:  Saline (Chlorhexidine)  Amount of cleaning:  Standard   Irrigation solution:  Sterile saline   Irrigation method: Angiocath and syringe. Skin repair:    Repair method:  Sutures   Suture size:  5-0   Suture material:  Prolene   Suture technique:  Horizontal mattress   Number of sutures:  1 Approximation:    Approximation:  Close Post-procedure details:    Dressing:  Open (no dressing)   Patient tolerance of procedure:  Tolerated well, no immediate complications  .Marland KitchenLaceration Repair Date/Time: 04/18/2017 8:05 PM Performed by: Lorin Glass Authorized by: Lorin Glass   Consent:    Consent obtained:  Verbal   Consent given by:  Patient   Risks discussed:  Infection, pain and poor cosmetic result   Alternatives discussed:  No treatment Anesthesia (see MAR for exact dosages):    Anesthesia method:  Local infiltration   Local anesthetic:  Lidocaine 2% WITH  epi Laceration details:    Location:  Face   Facial location: Left face lateral to eye.   Length (cm):  1 Repair type:    Repair type:  Simple Pre-procedure details:    Preparation:  Patient was prepped and draped in usual sterile fashion and imaging obtained to evaluate for foreign bodies Exploration:    Wound exploration: entire depth of wound probed and visualized   Treatment:    Area cleansed with:  Saline (chlorhexadine)   Amount of cleaning:  Standard   Irrigation solution:  Sterile saline Skin repair:    Repair method:  Tissue adhesive Approximation:    Approximation:  Close Post-procedure details:    Dressing:  Open (no dressing)   Patient tolerance of procedure:  Tolerated well, no immediate complications    Medications Ordered in ED Medications  lidocaine-EPINEPHrine (XYLOCAINE W/EPI) 2 %-1:200000 (PF) injection 10 mL (10 mLs Infiltration Given by Other 04/18/17 1756)  Tdap (BOOSTRIX) injection 0.5 mL (0.5 mLs Intramuscular Given 04/18/17 1809)     Initial Impression / Assessment and Plan / ED Course  I have reviewed the triage vital signs and the nursing notes.  Pertinent labs & imaging results that were available during my care of the patient were reviewed by me and considered in my medical decision making (see chart for details).    Pressure irrigation performed. Wound explored and base of wound visualized in a bloodless field without evidence of foreign body.  Laceration occurred < 8 hours prior to repair which was well tolerated. Tdap updated. Pt discharged  without antibiotics.  Based on age CT head was obtained which did not show any acute cranial process or retained foreign bodies.   Normal neuro exam neck with full active pain-free range of motion without tenderness step-offs or deformities. Discussed suture home care and tissue adhesive care with patient and answered questions. Pt to follow-up for wound check and suture removal in 7 days; they are to return to  the ED or PCP sooner for signs of infection. Pt is hemodynamically stable with no complaints prior to dc. Her blood pressure was noted to be elevated, she was instructed to follow-up with her primary care doctor regarding this. Chart review shows that on previous ED visits her blood pressure has also been elevated. She is asymptomatic and this does not appear related to her ED visit today.  The patient was seen by Dr. Ralene Bathe who evaluated the patient and agreed with my plan.   Final Clinical Impressions(s) / ED Diagnoses   Final diagnoses:  Laceration of forehead, initial encounter  Fall, initial encounter    New Prescriptions Discharge Medication List as of 04/18/2017  6:48 PM       Lorin Glass, PA-C 04/18/17 2012    Quintella Reichert, MD 04/18/17 2351

## 2017-04-18 NOTE — ED Notes (Signed)
ED Provider at bedside. 

## 2017-05-08 ENCOUNTER — Other Ambulatory Visit: Payer: Self-pay | Admitting: Surgery

## 2017-05-08 DIAGNOSIS — R928 Other abnormal and inconclusive findings on diagnostic imaging of breast: Secondary | ICD-10-CM

## 2017-05-29 ENCOUNTER — Ambulatory Visit
Admission: RE | Admit: 2017-05-29 | Discharge: 2017-05-29 | Disposition: A | Discharge status: Home / Self Care | Payer: Medicare Other | Source: Ambulatory Visit | Attending: Surgery | Admitting: Surgery

## 2017-05-29 DIAGNOSIS — R928 Other abnormal and inconclusive findings on diagnostic imaging of breast: Secondary | ICD-10-CM

## 2017-05-29 MED ORDER — GADOBENATE DIMEGLUMINE 529 MG/ML IV SOLN
13.0000 mL | Freq: Once | INTRAVENOUS | Status: AC | PRN
  Administered 2017-05-29: 13 mL via INTRAVENOUS

## 2017-05-29 NOTE — Progress Notes (Signed)
Please make sure the patient has an appointment to see me.  I need to do a punch biopsy.

## 2017-06-08 ENCOUNTER — Other Ambulatory Visit: Payer: Self-pay | Admitting: Surgery

## 2018-04-12 ENCOUNTER — Encounter (HOSPITAL_COMMUNITY): Payer: Self-pay

## 2018-04-12 ENCOUNTER — Other Ambulatory Visit: Payer: Self-pay

## 2018-04-12 ENCOUNTER — Emergency Department (HOSPITAL_COMMUNITY)
Admission: EM | Admit: 2018-04-12 | Discharge: 2018-04-12 | Disposition: A | Discharge status: Home / Self Care | Payer: Medicare Other | Attending: Emergency Medicine | Admitting: Emergency Medicine

## 2018-04-12 DIAGNOSIS — I160 Hypertensive urgency: Secondary | ICD-10-CM

## 2018-04-12 DIAGNOSIS — T63441A Toxic effect of venom of bees, accidental (unintentional), initial encounter: Secondary | ICD-10-CM | POA: Diagnosis not present

## 2018-04-12 DIAGNOSIS — T7840XA Allergy, unspecified, initial encounter: Secondary | ICD-10-CM | POA: Diagnosis not present

## 2018-04-12 DIAGNOSIS — I161 Hypertensive emergency: Secondary | ICD-10-CM

## 2018-04-12 DIAGNOSIS — R601 Generalized edema: Secondary | ICD-10-CM | POA: Diagnosis present

## 2018-04-12 DIAGNOSIS — Z79899 Other long term (current) drug therapy: Secondary | ICD-10-CM | POA: Insufficient documentation

## 2018-04-12 LAB — CBC WITH DIFFERENTIAL/PLATELET
BASOS PCT: 0 %
Basophils Absolute: 0 10*3/uL (ref 0.0–0.1)
EOS ABS: 0 10*3/uL (ref 0.0–0.7)
EOS PCT: 0 %
HCT: 41.4 % (ref 36.0–46.0)
HEMOGLOBIN: 13.4 g/dL (ref 12.0–15.0)
Lymphocytes Relative: 16 %
Lymphs Abs: 0.8 10*3/uL (ref 0.7–4.0)
MCH: 28.3 pg (ref 26.0–34.0)
MCHC: 32.4 g/dL (ref 30.0–36.0)
MCV: 87.5 fL (ref 78.0–100.0)
MONOS PCT: 6 %
Monocytes Absolute: 0.3 10*3/uL (ref 0.1–1.0)
NEUTROS ABS: 4 10*3/uL (ref 1.7–7.7)
NEUTROS PCT: 78 %
PLATELETS: 158 10*3/uL (ref 150–400)
RBC: 4.73 MIL/uL (ref 3.87–5.11)
RDW: 14.7 % (ref 11.5–15.5)
WBC: 5.1 10*3/uL (ref 4.0–10.5)

## 2018-04-12 LAB — BASIC METABOLIC PANEL
ANION GAP: 12 (ref 5–15)
BUN: 16 mg/dL (ref 8–23)
CALCIUM: 9.3 mg/dL (ref 8.9–10.3)
CHLORIDE: 106 mmol/L (ref 98–111)
CO2: 23 mmol/L (ref 22–32)
Creatinine, Ser: 0.96 mg/dL (ref 0.44–1.00)
GFR calc non Af Amer: 54 mL/min — ABNORMAL LOW (ref >60)
GLUCOSE: 113 mg/dL — AB (ref 70–99)
POTASSIUM: 4.1 mmol/L (ref 3.5–5.1)
Sodium: 141 mmol/L (ref 135–145)

## 2018-04-12 MED ORDER — HYDRALAZINE HCL 20 MG/ML IJ SOLN
10.0000 mg | Freq: Once | INTRAMUSCULAR | Status: AC
  Administered 2018-04-12: 10 mg via INTRAVENOUS
  Filled 2018-04-12: qty 1

## 2018-04-12 MED ORDER — HYDROCHLOROTHIAZIDE 25 MG PO TABS: 25.0000 mg | ORAL_TABLET | Freq: Every day | ORAL | 0 refills | Status: AC

## 2018-04-12 MED ORDER — RANITIDINE HCL 150 MG/10ML PO SYRP
300.0000 mg | ORAL_SOLUTION | Freq: Once | ORAL | Status: AC
  Administered 2018-04-12: 300 mg via ORAL
  Filled 2018-04-12: qty 20

## 2018-04-12 MED ORDER — DIPHENHYDRAMINE HCL 50 MG/ML IJ SOLN
25.0000 mg | Freq: Once | INTRAMUSCULAR | Status: AC
  Administered 2018-04-12: 25 mg via INTRAMUSCULAR
  Filled 2018-04-12: qty 1

## 2018-04-12 MED ORDER — DIPHENHYDRAMINE HCL 25 MG PO TABS
25.0000 mg | ORAL_TABLET | Freq: Four times a day (QID) | ORAL | 0 refills | Status: AC
Start: 2018-04-12 — End: ?

## 2018-04-12 MED ORDER — PREDNISONE 10 MG PO TABS: 50.0000 mg | ORAL_TABLET | Freq: Every day | ORAL | 0 refills | Status: DC

## 2018-04-12 MED ORDER — DIPHENHYDRAMINE HCL 25 MG PO CAPS: 25.0000 mg | ORAL_CAPSULE | Freq: Once | ORAL | Status: DC

## 2018-04-12 MED ORDER — HYDROCORTISONE 1 % EX OINT
TOPICAL_OINTMENT | Freq: Once | CUTANEOUS | Status: DC
  Filled 2018-04-12: qty 28

## 2018-04-12 MED ORDER — PREDNISONE 20 MG PO TABS
60.0000 mg | ORAL_TABLET | Freq: Once | ORAL | Status: AC
Start: 2018-04-12 — End: 2018-04-12
  Administered 2018-04-12: 60 mg via ORAL
  Filled 2018-04-12: qty 3

## 2018-04-12 NOTE — ED Notes (Signed)
Pt. Placed on Purewick.  

## 2018-04-12 NOTE — Consult Note (Addendum)
Consultation Note    Abigail Shannon PPJ:[id number] DOB: May 10, 1936 DOA: 04/12/2018   PCP: Haywood Pao, MD   Patient coming from:  Home    Chief Complaint: Hypertensive Urgency   HPI: Abigail Shannon is a 82 y.o. female with medical history significant for hypertension, brought to the ED after sustaining bee sting (multiple).  She received 25 mg of Benadryl prior to ED arrival, and the episode occurred 3 hours prior to presentation to the emergency department.  She responded well to the medication.  Of note, during her hospital stay, she was noted to have a very elevated blood pressure initially at 184/83, latest at 580 [id number] diastolic.  She reported taking her last blood pressure meds in the morning, and she is compliant with them.  However, she has not been seen by her PCP in over a year, and has not readjusted her meds.  She denies any headaches or vision changes.  No confusion is reported.  She denies any neck pain.  No seizures are noted.  She denies any chest pain or palpitations.  She denies any shortness of breath or cough.  No dysphagia.  She denies any abdominal pain nausea or vomiting.  She denies any lower extremity swelling or calf pain.  She denies any fever or chills.  No tobacco, alcohol or recreational drug use.  Her appetite is normal, denies heavy use of salt in foods.  Of note, the patient did report some anxiety due to her bee sting on presentation, pain and rash, which is now well improved.  She received hydralazine 10 mg x 1, and being monitored.   Review of Systems:  As per HPI otherwise all other systems reviewed and are negative  Past Medical History:  Diagnosis Date  . Hypertension     Past Surgical History:  Procedure Laterality Date  . BREAST EXCISIONAL BIOPSY Left    2 benign breast biopsies years ago  . BREAST SURGERY    . KNEE SURGERY      Social History Social History   Socioeconomic History  . Marital status: Widowed    Spouse name: Not  on file  . Number of children: Not on file  . Years of education: Not on file  . Highest education level: Not on file  Occupational History  . Not on file  Social Needs  . Financial resource strain: Not on file  . Food insecurity:    Worry: Not on file    Inability: Not on file  . Transportation needs:    Medical: Not on file    Non-medical: Not on file  Tobacco Use  . Smoking status: Never Smoker  . Smokeless tobacco: Never Used  Substance and Sexual Activity  . Alcohol use: No  . Drug use: No  . Sexual activity: Not on file  Lifestyle  . Physical activity:    Days per week: Not on file    Minutes per session: Not on file  . Stress: Not on file  Relationships  . Social connections:    Talks on phone: Not on file    Gets together: Not on file    Attends religious service: Not on file    Active member of club or organization: Not on file    Attends meetings of clubs or organizations: Not on file    Relationship status: Not on file  . Intimate partner violence:    Fear of current or ex partner: Not on file    Emotionally  abused: Not on file    Physically abused: Not on file    Forced sexual activity: Not on file  Other Topics Concern  . Not on file  Social History Narrative  . Not on file     Allergies  Allergen Reactions  . Aspirin Swelling    History reviewed. No pertinent family history.     Prior to Admission medications   Medication Sig Start Date End Date Taking? Authorizing Provider  amLODipine (NORVASC) 10 MG tablet Take 1 tablet (10 mg total) by mouth daily. 03/31/17   Gareth Morgan, MD  cephALEXin (KEFLEX) 500 MG capsule Take 1 capsule (500 mg total) by mouth 4 (four) times daily. 03/28/17   Virgel Manifold, MD  HYDROcodone-acetaminophen (NORCO/VICODIN) 5-325 MG tablet Take 1 tablet by mouth every 4 (four) hours as needed. 03/28/17   Virgel Manifold, MD  losartan (COZAAR) 100 MG tablet Take 100 mg by mouth daily.    [provider]  naproxen  sodium (ANAPROX) 220 MG tablet Take 220 mg by mouth every 8 (eight) hours as needed (knee pain).    [provider]     Physical Exam:  Vitals:   04/12/18 1515 04/12/18 1530 04/12/18 1602 04/12/18 1605  BP: (!) 220/85 (!) 223/95 (!) 253/101 (!) 253/102  Pulse: 62 62  61  Resp: 15 16 16 17   Temp:   97.8 F (36.6 C) (!) 97.3 F (36.3 C)  TempSrc:   Oral Oral  SpO2:  100%  100%  Weight:    45.4 kg (100 lb)  Height:    5\' 4"  (1.626 m)   Constitutional: NAD, calm, comfortable  Eyes: PERRL, left leave mildly swollen, and conjunctivae normal.  Arcus senilis. ENMT: Mucous membranes are moist, without exudate or lesions  Neck: normal, supple, no masses, no thyromegaly Respiratory: clear to auscultation bilaterally, no wheezing, no crackles. Normal respiratory effort  Cardiovascular: Regular rate and rhythm, 6 murmur, rubs or gallops. No extremity edema. 2+ pedal pulses. No carotid bruits.  Abdomen: Soft, non tender, No hepatosplenomegaly. Bowel sounds positive.  Musculoskeletal: no clubbing / cyanosis. Moves all extremities Skin: no jaundice, No lesions.  Mild rash is now improving Neurologic: Sensation intact  Strength equal in all extremities Psychiatric:   Alert and oriented x 3. Normal mood.    CBC    Component Value Date/Time   WBC 5.1 04/12/2018 1631   RBC 4.73 04/12/2018 1631   HGB 13.4 04/12/2018 1631   HCT 41.4 04/12/2018 1631   PLT 158 04/12/2018 1631   MCV 87.5 04/12/2018 1631   MCH 28.3 04/12/2018 1631   MCHC 32.4 04/12/2018 1631   RDW 14.7 04/12/2018 1631   LYMPHSABS PENDING 04/12/2018 1631   MONOABS PENDING 04/12/2018 1631   EOSABS PENDING 04/12/2018 1631   BASOSABS PENDING 04/12/2018 1631   BMET    Component Value Date/Time   NA 141 04/12/2018 1631   K 4.1 04/12/2018 1631   CL 106 04/12/2018 1631   CO2 23 04/12/2018 1631   GLUCOSE 113 (H) 04/12/2018 1631   BUN 16 04/12/2018 1631   CREATININE 0.96 04/12/2018 1631   CALCIUM 9.3 04/12/2018 1631    GFRNONAA 54 (L) 04/12/2018 1631   GFRAA >60 04/12/2018 1631       Sepsis Labs: @LABRCNTIP (procalcitonin:4,lacticidven:4) )No results found for this or any previous visit (from the past 240 hour(s)).   Radiological Exams on Admission: No results found.  EKG: Independently reviewed.  Assessment/Plan Active Problems:   Hypertensive urgency  Hypertensive urgency, in  the setting of recent anaphylactic event, anxiety, and likely need for adjustment in her BP meds, as she has not been seen by PCP in more than a year.   Patient was compliant with her medications.  No suspicion for posterior Reverse Encephalopathy Syndrome patient is essentially asymptomatic. EKG SR with RBBB (unclear if this was priorly seen, no available EKG to compare) Will monitor response to Hydralazine given at the ED, currently at 183/ 78 fron the 081K GYJEHUDJ/497 diastolic   Recommend that the patient be seen by PCP this week for readjustment of her current meds which include  Norvasc and Cozaar  (PCP is Dr. Osborne Casco) . May need formal Cards eval when discharged Hydralazine 10 mg IV  prn while in hospital   BP (!) 183/78   Pulse 69   Temp (!) 97.3 F (36.3 C) (Oral)   Resp (!) 22   Ht 5\' 4"  (1.626 m)   Wt 45.4 kg (100 lb)   SpO2 99%   BMI 17.16 kg/m   Anaphylaxis after Bee Sting Responded well to Benadryl  Anticipate DC from the ED   Sharene Butters, PA-C [facility]   Amion text  [id number]   04/12/2018, 4:47 PM

## 2018-04-12 NOTE — H&P (Deleted)
Consultation Note    Abigail Shannon OXB:[id number] DOB: 07-18-1936 DOA: 04/12/2018   PCP: Haywood Pao, MD   Patient coming from:  Home    Chief Complaint: Hypertensive Urgency   HPI: Abigail Shannon is a 82 y.o. female with medical history significant for hypertension, brought to the ED after sustaining bee sting (multiple).  She received 25 mg of Benadryl prior to ED arrival, and the episode occurred 3 hours prior to presentation to the emergency department.  She responded well to the medication.  Of note, during her hospital stay, she was noted to have a very elevated blood pressure initially at 184/83, latest at 683 [id number]-798 diastolic.  She reported taking her last blood pressure meds in the morning, and she is compliant with them.  However, she has not been seen by her PCP in over a year, and has not readjusted her meds.  She denies any headaches or vision changes.  No confusion is reported.  She denies any neck pain.  No seizures are noted.  She denies any chest pain or palpitations.  She denies any shortness of breath or cough.  No dysphagia.  She denies any abdominal pain nausea or vomiting.  She denies any lower extremity swelling or calf pain.  She denies any fever or chills.  No tobacco, alcohol or recreational drug use.  Her appetite is normal, denies heavy use of salt in foods.  Of note, the patient did report some anxiety due to her bee sting on presentation, pain and rash, which is now well improved.  She received hydralazine 10 mg x 1, and being monitored.   Review of Systems:  As per HPI otherwise all other systems reviewed and are negative  Past Medical History:  Diagnosis Date  . Hypertension     Past Surgical History:  Procedure Laterality Date  . BREAST EXCISIONAL BIOPSY Left    2 benign breast biopsies years ago  . BREAST SURGERY    . KNEE SURGERY      Social History Social History   Socioeconomic History  . Marital status: Widowed    Spouse name: Not  on file  . Number of children: Not on file  . Years of education: Not on file  . Highest education level: Not on file  Occupational History  . Not on file  Social Needs  . Financial resource strain: Not on file  . Food insecurity:    Worry: Not on file    Inability: Not on file  . Transportation needs:    Medical: Not on file    Non-medical: Not on file  Tobacco Use  . Smoking status: Never Smoker  . Smokeless tobacco: Never Used  Substance and Sexual Activity  . Alcohol use: No  . Drug use: No  . Sexual activity: Not on file  Lifestyle  . Physical activity:    Days per week: Not on file    Minutes per session: Not on file  . Stress: Not on file  Relationships  . Social connections:    Talks on phone: Not on file    Gets together: Not on file    Attends religious service: Not on file    Active member of club or organization: Not on file    Attends meetings of clubs or organizations: Not on file    Relationship status: Not on file  . Intimate partner violence:    Fear of current or ex partner: Not on file    Emotionally  abused: Not on file    Physically abused: Not on file    Forced sexual activity: Not on file  Other Topics Concern  . Not on file  Social History Narrative  . Not on file     Allergies  Allergen Reactions  . Aspirin Swelling    History reviewed. No pertinent family history.     Prior to Admission medications   Medication Sig Start Date End Date Taking? Authorizing Provider  amLODipine (NORVASC) 10 MG tablet Take 1 tablet (10 mg total) by mouth daily. 03/31/17   Gareth Morgan, MD  cephALEXin (KEFLEX) 500 MG capsule Take 1 capsule (500 mg total) by mouth 4 (four) times daily. 03/28/17   Virgel Manifold, MD  HYDROcodone-acetaminophen (NORCO/VICODIN) 5-325 MG tablet Take 1 tablet by mouth every 4 (four) hours as needed. 03/28/17   Virgel Manifold, MD  losartan (COZAAR) 100 MG tablet Take 100 mg by mouth daily.    [provider]  naproxen  sodium (ANAPROX) 220 MG tablet Take 220 mg by mouth every 8 (eight) hours as needed (knee pain).    [provider]     Physical Exam:  Vitals:   04/12/18 1515 04/12/18 1530 04/12/18 1602 04/12/18 1605  BP: (!) 220/85 (!) 223/95 (!) 253/101 (!) 253/102  Pulse: 62 62  61  Resp: 15 16 16 17   Temp:   97.8 F (36.6 C) (!) 97.3 F (36.3 C)  TempSrc:   Oral Oral  SpO2:  100%  100%  Weight:    45.4 kg (100 lb)  Height:    5\' 4"  (1.626 m)   Constitutional: NAD, calm, comfortable  Eyes: PERRL, left leave mildly swollen, and conjunctivae normal.  Arcus senilis. ENMT: Mucous membranes are moist, without exudate or lesions  Neck: normal, supple, no masses, no thyromegaly Respiratory: clear to auscultation bilaterally, no wheezing, no crackles. Normal respiratory effort  Cardiovascular: Regular rate and rhythm, 6 murmur, rubs or gallops. No extremity edema. 2+ pedal pulses. No carotid bruits.  Abdomen: Soft, non tender, No hepatosplenomegaly. Bowel sounds positive.  Musculoskeletal: no clubbing / cyanosis. Moves all extremities Skin: no jaundice, No lesions.  Mild rash is now improving Neurologic: Sensation intact  Strength equal in all extremities Psychiatric:   Alert and oriented x 3. Normal mood.     Labs on Admission: I have personally reviewed following labs and imaging studies  CBC: No results for input(s): WBC, NEUTROABS, HGB, HCT, MCV, PLT in the last 168 hours.  Basic Metabolic Panel: No results for input(s): NA, K, CL, CO2, GLUCOSE, BUN, CREATININE, CALCIUM, MG, PHOS in the last 168 hours.  GFR: CrCl cannot be calculated (Patient's most recent lab result is older than the maximum 21 days allowed.).  Liver Function Tests: No results for input(s): AST, ALT, ALKPHOS, BILITOT, PROT, ALBUMIN in the last 168 hours. No results for input(s): LIPASE, AMYLASE in the last 168 hours. No results for input(s): AMMONIA in the last 168 hours.  Coagulation Profile: No results  for input(s): INR, PROTIME in the last 168 hours.  Cardiac Enzymes: No results for input(s): CKTOTAL, CKMB, CKMBINDEX, TROPONINI in the last 168 hours.  BNP (last 3 results) No results for input(s): PROBNP in the last 8760 hours.  HbA1C: No results for input(s): HGBA1C in the last 72 hours.  CBG: No results for input(s): GLUCAP in the last 168 hours.  Lipid Profile: No results for input(s): CHOL, HDL, LDLCALC, TRIG, CHOLHDL, LDLDIRECT in the last 72 hours.  Thyroid Function Tests: No  results for input(s): TSH, T4TOTAL, FREET4, T3FREE, THYROIDAB in the last 72 hours.  Anemia Panel: No results for input(s): VITAMINB12, FOLATE, FERRITIN, TIBC, IRON, RETICCTPCT in the last 72 hours.  Urine analysis:    Component Value Date/Time   LABSPEC 1.015 12/26/2010 1007   PHURINE 5.5 12/26/2010 1007   GLUCOSEU NEGATIVE 12/26/2010 1007   HGBUR NEGATIVE 12/26/2010 1007   BILIRUBINUR NEGATIVE 12/26/2010 1007   KETONESUR NEGATIVE 12/26/2010 1007   PROTEINUR NEGATIVE 12/26/2010 1007   UROBILINOGEN 2.0 (H) 12/26/2010 1007   NITRITE NEGATIVE 12/26/2010 1007   LEUKOCYTESUR  12/26/2010 1007    NEGATIVE Biochemical Testing Only. Please order routine urinalysis from main lab if confirmatory testing is needed.    Sepsis Labs: @LABRCNTIP (procalcitonin:4,lacticidven:4) )No results found for this or any previous visit (from the past 240 hour(s)).   Radiological Exams on Admission: No results found.  EKG: Independently reviewed.  Assessment/Plan Active Problems:   Hypertensive urgency  Hypertensive urgency, in the setting of recent anaphylactic event, anxiety, and likely need for adjustment in her BP meds, as she has not been seen by PCP in more than a year.   Patient was compliant with her medications.  No suspicion for posterior Reverse Encephalopathy Syndrome patient is essentially asymptomatic. EKG SR with RBBB (unclear if this was priorly seen, no available EKG to compare) Will monitor  response to Hydralazine, expecting the patient to improve BP .  Recommend that the patient be seen by PCP this week for readjustment of her current meds which include Cozaar and Norvasc (PCP is Dr. Osborne Casco) . May need formal Cards eval when discharged Hydralazine 5-10 mg IV q 6 hrs prn for BP 210/110   Anaphylaxis after Bee Sting Responded well to Benadryl  Anticipate DC from the ED   Sharene Butters, PA-C [facility]   Amion text  [phone]   04/12/2018, 4:47 PM

## 2018-04-12 NOTE — ED Triage Notes (Signed)
Pt endorses being stung by several bees 1 hour ago and has burning sensation all over. Denies throat swelling or shob. No wheezing. Hypertensive. Took 50mg  of benadryl pta.

## 2018-04-12 NOTE — ED Notes (Signed)
Pt has taken 50mg  of benadryl PO

## 2018-04-12 NOTE — Discharge Instructions (Addendum)
We saw in the ER for bee sting. Seems like you had a minor allergic reaction.  Please take the medications prescribed.  We also noted that you have elevated blood pressure. We are starting you on a new blood pressure medication, and recommend that you follow-up with your primary care doctor as soon as possible for optimal BP management.  Please call them for the earliest available appointment. Return to the ER immediately if you start having chest pain, severe headaches, new numbness, weakness or dizziness.

## 2018-04-12 NOTE — ED Provider Notes (Signed)
[facility] EMERGENCY DEPARTMENT Provider Note   CSN: [id number] Arrival date & time: 04/12/18  1231     History   Chief Complaint Chief Complaint  Patient presents with  . Insect Bite  . Allergic Reaction    HPI Abigail Shannon is a 82 y.o. female.  HPI 82 y/o F comes in with cc of bee sting. She has hx of HTN. Pt reports that about an hour ago she was  stung by multiple bees.  Patient has history of being stung once before, and at that time her whole body swelled up, therefore she decided to come to the ER.  Patient had taken to into 25 mg Benadryl prior to ED arrival.  She denies any facial swelling, nausea, abdominal discomfort, shortness of breath, wheezing.  Patient does have urticarial rash that is itchy.  The incident took place about 3 hours prior to ED arrival.  Patient is also noted to be hypertensive.  She is unsure what medication she is taking.  There has been no new medication changes.  Patient denies any recent chest pain, shortness of breath, orthopnea, leg swelling.  Patient's next PCP appointment is in October.  Past Medical History:  Diagnosis Date  . Hypertension     Patient Active Problem List   Diagnosis Date Noted  . Hypertensive urgency 04/12/2018    Past Surgical History:  Procedure Laterality Date  . BREAST EXCISIONAL BIOPSY Left    2 benign breast biopsies years ago  . BREAST SURGERY    . KNEE SURGERY       OB History   None      Home Medications    Prior to Admission medications   Medication Sig Start Date End Date Taking? Authorizing Provider  amLODipine (NORVASC) 10 MG tablet Take 1 tablet (10 mg total) by mouth daily. 03/31/17   Gareth Morgan, MD  cephALEXin (KEFLEX) 500 MG capsule Take 1 capsule (500 mg total) by mouth 4 (four) times daily. 03/28/17   Virgel Manifold, MD  diphenhydrAMINE (BENADRYL) 25 MG tablet Take 1 tablet (25 mg total) by mouth every 6 (six) hours. 04/12/18   Varney Biles, MD    hydrochlorothiazide (HYDRODIURIL) 25 MG tablet Take 1 tablet (25 mg total) by mouth daily. 04/12/18   Varney Biles, MD  HYDROcodone-acetaminophen (NORCO/VICODIN) 5-325 MG tablet Take 1 tablet by mouth every 4 (four) hours as needed. 03/28/17   Virgel Manifold, MD  losartan (COZAAR) 100 MG tablet Take 100 mg by mouth daily.    [provider]  naproxen sodium (ANAPROX) 220 MG tablet Take 220 mg by mouth every 8 (eight) hours as needed (knee pain).    [provider]  predniSONE (DELTASONE) 10 MG tablet Take 5 tablets (50 mg total) by mouth daily. 04/12/18   Varney Biles, MD    Family History History reviewed. No pertinent family history.  Social History Social History   Tobacco Use  . Smoking status: Never Smoker  . Smokeless tobacco: Never Used  Substance Use Topics  . Alcohol use: No  . Drug use: No     Allergies   Aspirin   Review of Systems Review of Systems  Constitutional: Positive for activity change.  Respiratory: Negative for shortness of breath and wheezing.   Cardiovascular: Negative for chest pain.  Skin: Positive for rash.  Neurological: Negative for light-headedness.  All other systems reviewed and are negative.    Physical Exam Updated Vital Signs BP (!) 189/88   Pulse 70  Temp (!) 97.3 F (36.3 C) (Oral)   Resp (!) 24   Ht 5\' 4"  (1.626 m)   Wt 45.4 kg (100 lb)   SpO2 98%   BMI 17.16 kg/m   Physical Exam  Constitutional: She is oriented to person, place, and time. She appears well-developed.  HENT:  Head: Normocephalic and atraumatic.  Oral mucosa is normal no edema  Eyes: EOM are normal.  Neck: Normal range of motion. Neck supple.  Cardiovascular: Normal rate.  Pulmonary/Chest: Effort normal. No stridor. She has no wheezes.  Abdominal: Bowel sounds are normal.  Musculoskeletal: She exhibits no edema.  Neurological: She is alert and oriented to person, place, and time.  Skin: Skin is warm and dry. Rash noted.  Diffuse  urticarial rash over the lower extremities and patient's left arm.  Nursing note and vitals reviewed.    ED Treatments / Results  Labs (all labs ordered are listed, but only abnormal results are displayed) Labs Reviewed  BASIC METABOLIC PANEL - Abnormal; Notable for the following components:      Result Value   Glucose, Bld 113 (*)    GFR calc non Af Amer 54 (*)    All other components within normal limits  CBC WITH DIFFERENTIAL/PLATELET    EKG EKG Interpretation  Date/Time:  Monday April 12 2018 16:26:40 EDT Ventricular Rate:  61 PR Interval:    QRS Duration: 152 QT Interval:  509 QTC Calculation: 513 R Axis:   81 Text Interpretation:  Sinus rhythm Right bundle branch block No acute changes Nonspecific ST and T wave abnormality No significant change since last tracing Confirmed by Varney Biles [phone]) on 04/12/2018 5:19:49 PM   Radiology No results found.  Procedures Procedures (including critical care time)  Medications Ordered in ED Medications  hydrocortisone 1 % ointment (has no administration in time range)  predniSONE (DELTASONE) tablet 60 mg (60 mg Oral Given 04/12/18 1537)  ranitidine (ZANTAC) 150 MG/10ML syrup 300 mg (300 mg Oral Given 04/12/18 1537)  diphenhydrAMINE (BENADRYL) injection 25 mg (25 mg Intramuscular Given 04/12/18 1537)  hydrALAZINE (APRESOLINE) injection 10 mg (10 mg Intravenous Given 04/12/18 1636)     Initial Impression / Assessment and Plan / ED Course  I have reviewed the triage vital signs and the nursing notes.  Pertinent labs & imaging results that were available during my care of the patient were reviewed by me and considered in my medical decision making (see chart for details).  Clinical Course as of Apr 12 1814  Mon Apr 12, 2018  1814 Patient's blood pressure responded to IV hydralazine. Medicine was consulted, no concrete recommendations added.  We will start patient on hydrochlorothiazide 25 mg daily, and have her see  PCP.  Strict ER return precautions have been disclosed in the discharge summary.   BP(!): 189/88 [AN]    Clinical Course User Index [AN] Varney Biles, MD    82 year old female comes in with chief complaint of bee sting. Patient has a localized hypersensitivity reaction.  Clinically there is no signs or symptoms of anaphylaxis.  We will treat with oral medications.  The actual event took place more than 3 hours ago, therefore there is less of a concern for rebound symptoms.  Patient is also noted to be hypertensive. She is unsure what medication she is supposed to be taking.  Her blood pressure while I was in the room was 220/104.  It appears that she has had elevated blood pressure with her previous visits in the ED, and  she is asymptomatic at the moment.  There could be a component of anxiety, but patient really does not appear anxious and she is not tachycardic.  I am Benadryl given along with oral prednisone and Zantac -we will keep an eye on the blood pressure for now.  Final Clinical Impressions(s) / ED Diagnoses   Final diagnoses:  Allergic reaction, initial encounter  Hypertensive emergency    ED Discharge Orders        Ordered    hydrochlorothiazide (HYDRODIURIL) 25 MG tablet  Daily     04/12/18 1809    diphenhydrAMINE (BENADRYL) 25 MG tablet  Every 6 hours     04/12/18 1809    predniSONE (DELTASONE) 10 MG tablet  Daily     04/12/18 1809       Varney Biles, MD 04/12/18 1815

## 2018-04-14 IMAGING — MG MM CLIP PLACEMENT
2 series · 2 of 2 positions shown · non-contrast
Comparison: Previous exam(s).

CLINICAL DATA: Evaluate biopsy marker

EXAM:
DIAGNOSTIC LEFT MAMMOGRAM POST ULTRASOUND BIOPSY

[L ML]
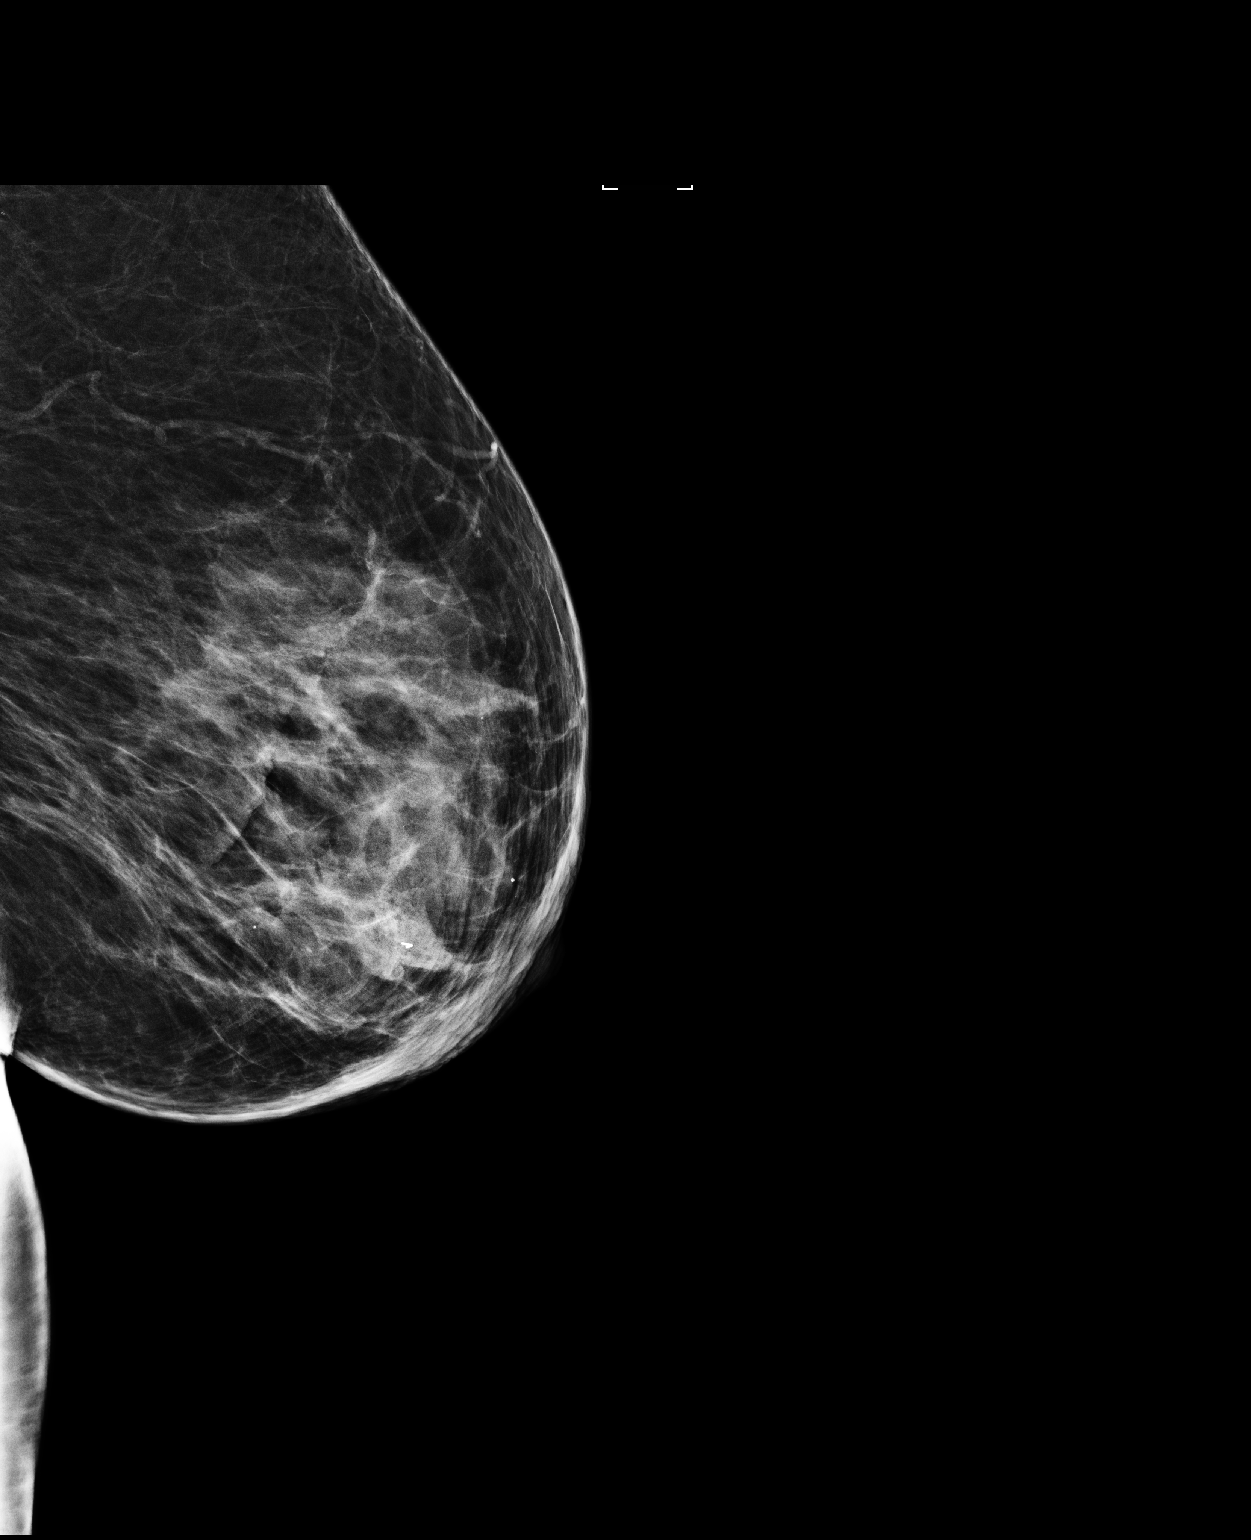

[L CC]
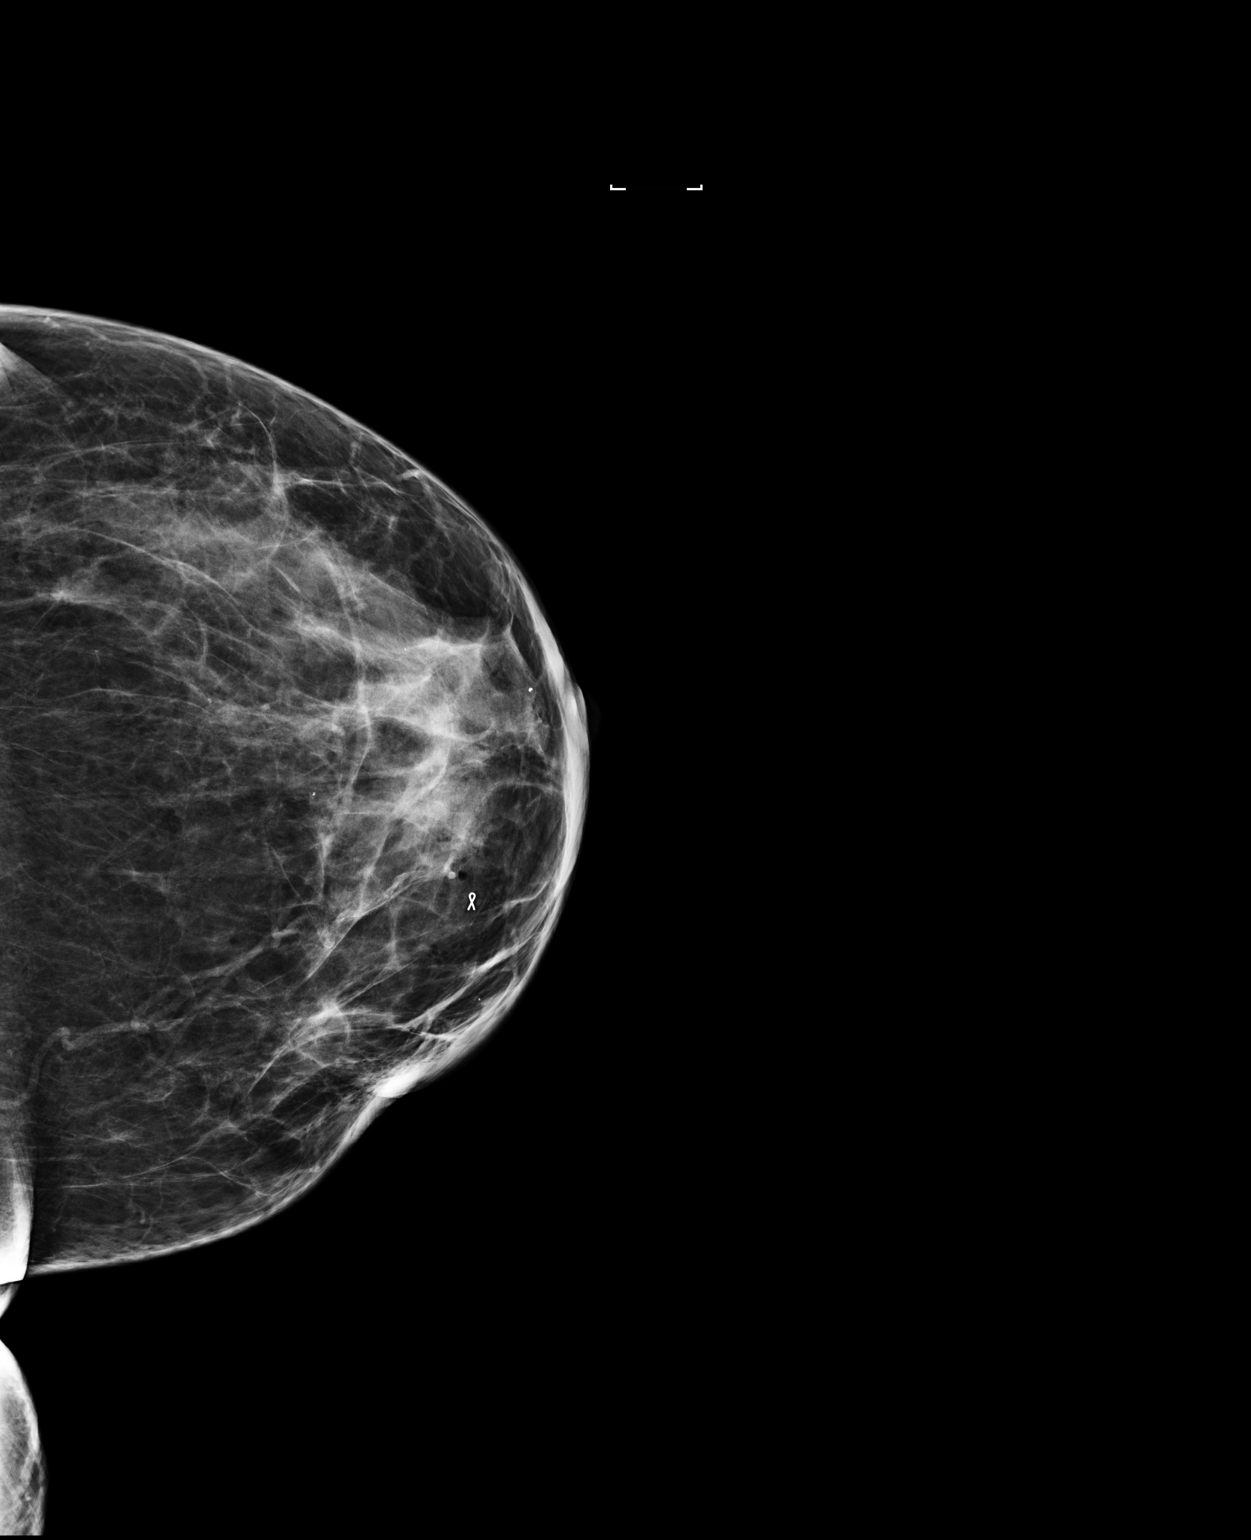

[2 of 2 positions shown; findings below may reference images not displayed]

FINDINGS: Mammographic images were obtained following ultrasound guided biopsy
of a left breast mass. The ribbon shaped clip is in expected
location.
IMPRESSION: The ribbon shaped clip is in expected location.

Final Assessment: Post Procedure Mammograms for Marker Placement

## 2018-04-14 IMAGING — US US BREAST BX W LOC DEV 1ST LESION IMG BX SPEC US GUIDE*L*
1 series · 13 of 13 positions shown · non-contrast
Comparison: Previous exam(s).

ADDENDUM:
Pathology revealed FIBROADENOMA of the Left breast, 9:30 o'clock.
This was found to be concordant by Dr. Rickie Moguel. Pathology
results were discussed with the patient by telephone. The patient
reported doing well after the biopsy with tenderness at the site.
Post biopsy instructions and care were reviewed and questions were
answered. The patient was encouraged to call The [REDACTED] of
consultation has been arranged with Dr. Paolo Mall at [REDACTED] on May 08, 2017 for evaluation of skin
thickening and pain of the Left breast. Consideration for a
bilateral breast MRI.

Pathology results reported by Paulus N Ceejay, RN on 04/13/2017.
CLINICAL DATA: Skin thickening, pain, and lumpiness. Left-sided
breast mass.
EXAM:
ULTRASOUND GUIDED LEFT BREAST CORE NEEDLE BIOPSY

[Series 1: us breast bx w loc dev 1st lesion img bx spec us g · 0.07mm/px · 13 of 13 slices shown]
[im 1/13]
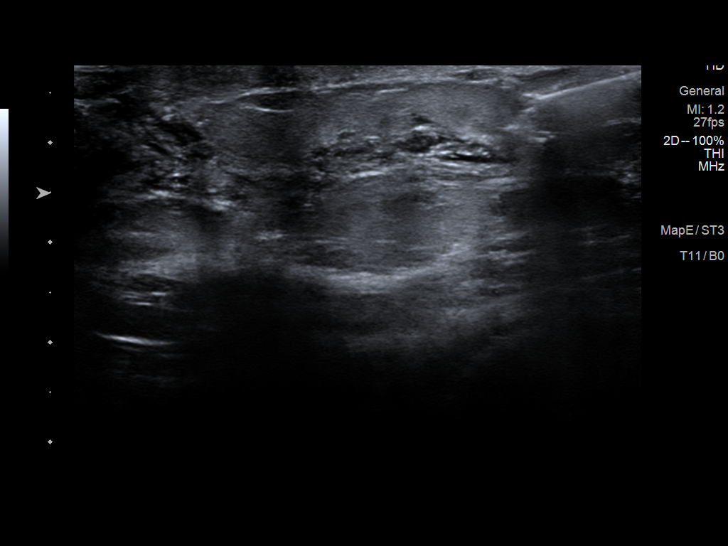
[im 2/13]
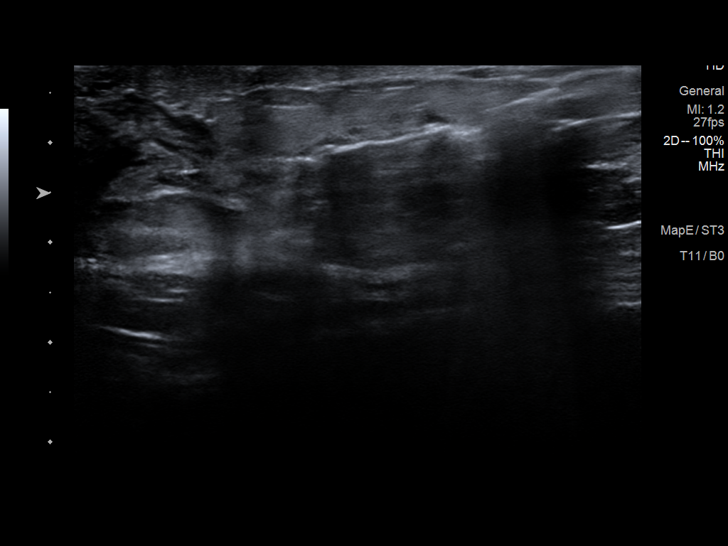
[im 3/13]
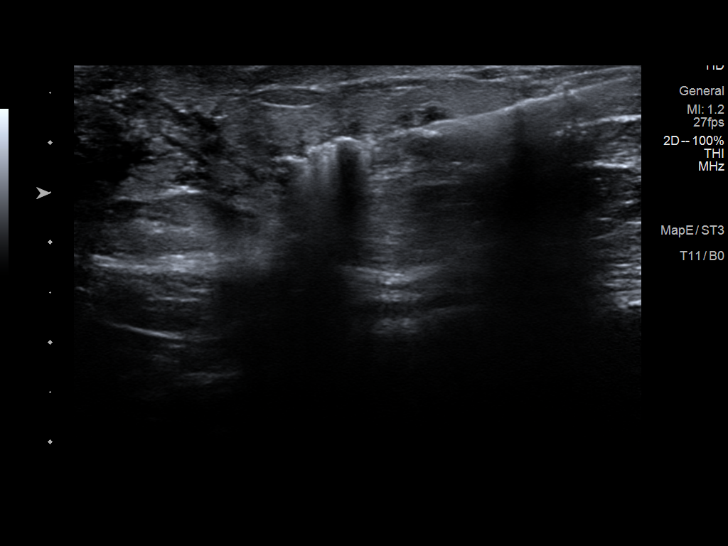
[im 4/13]
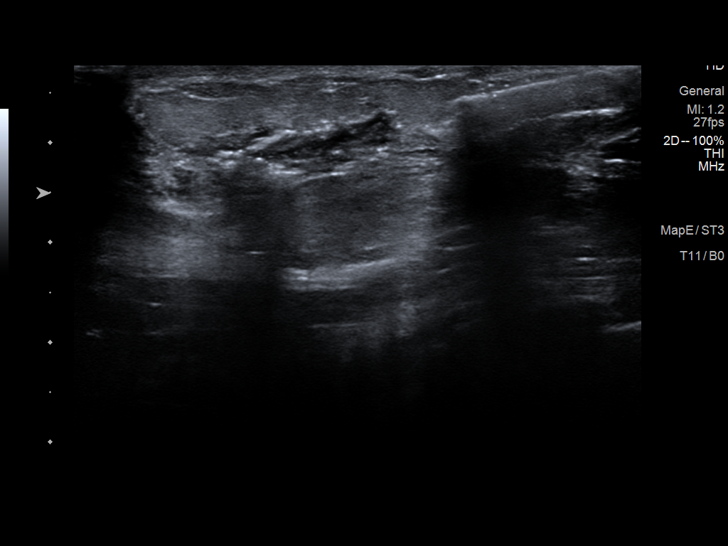
[im 5/13]
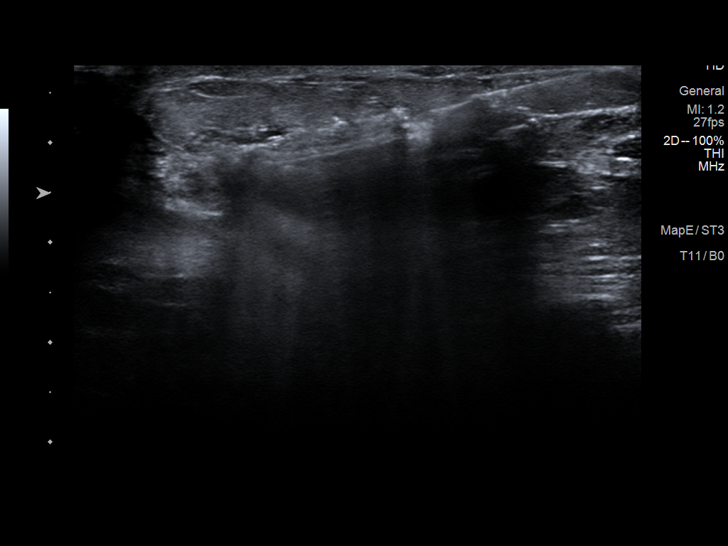
[im 6/13]
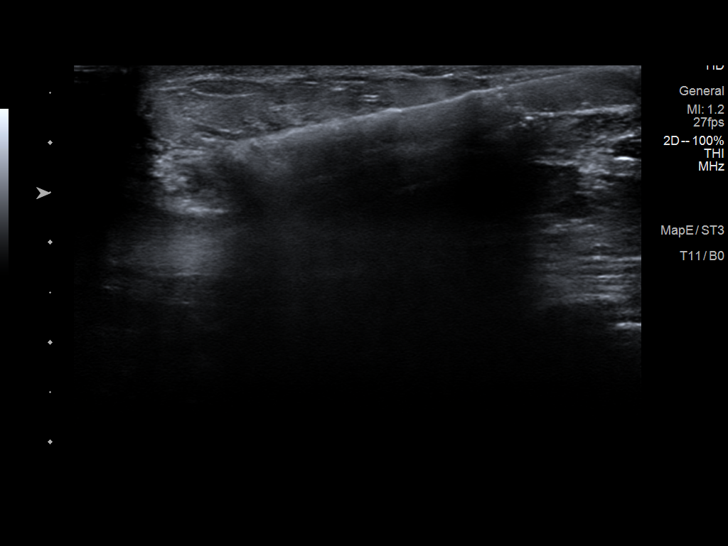
[im 7/13]
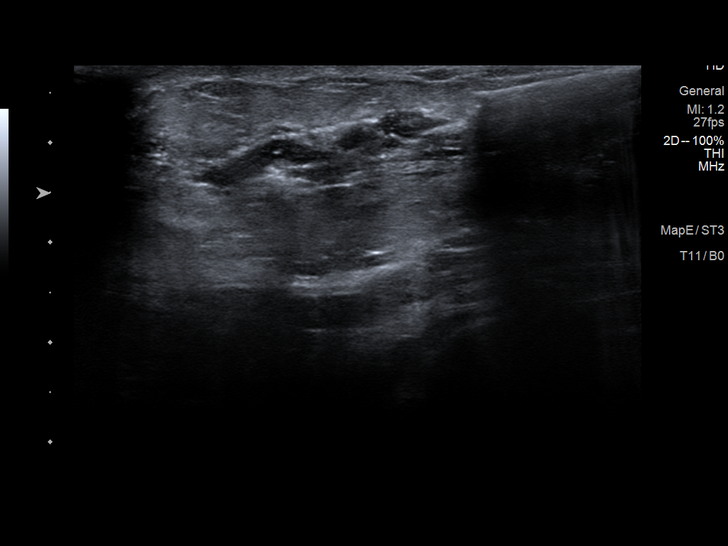
[im 8/13]
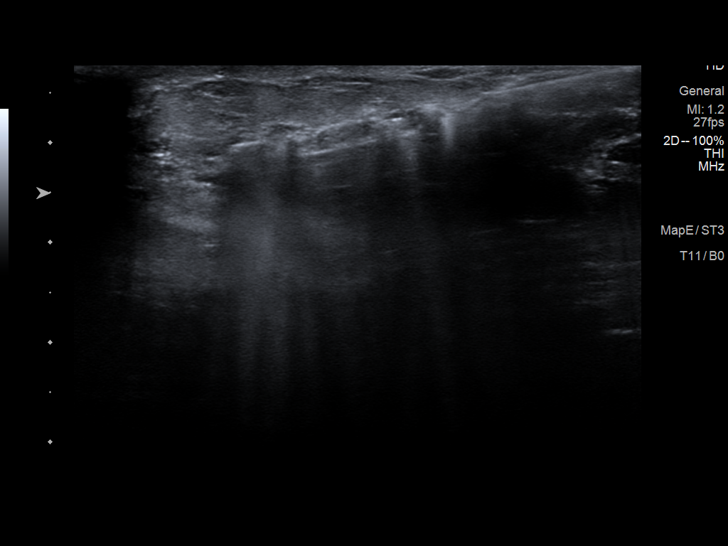
[im 9/13]
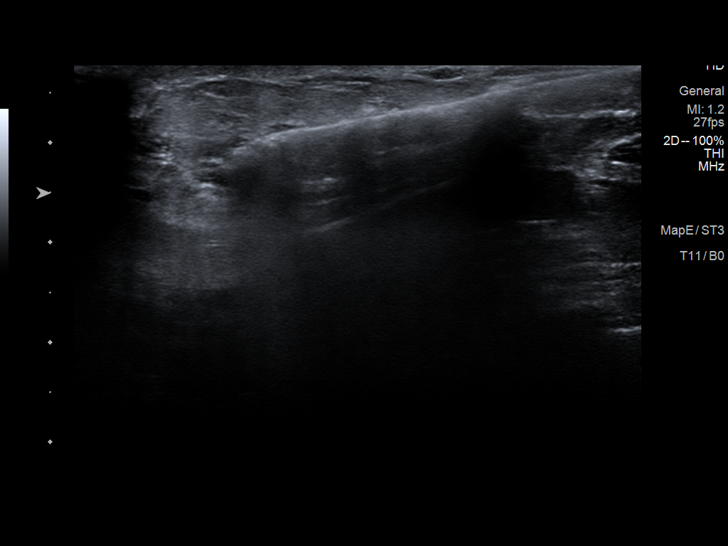
[im 10/13]
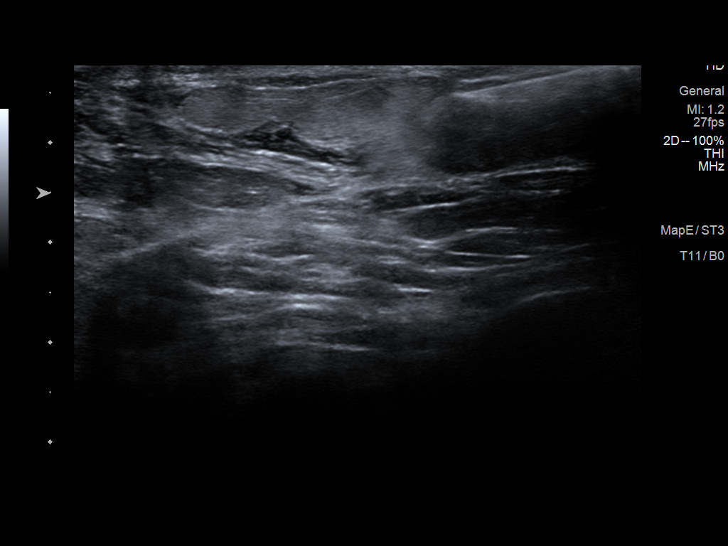
[im 11/13]
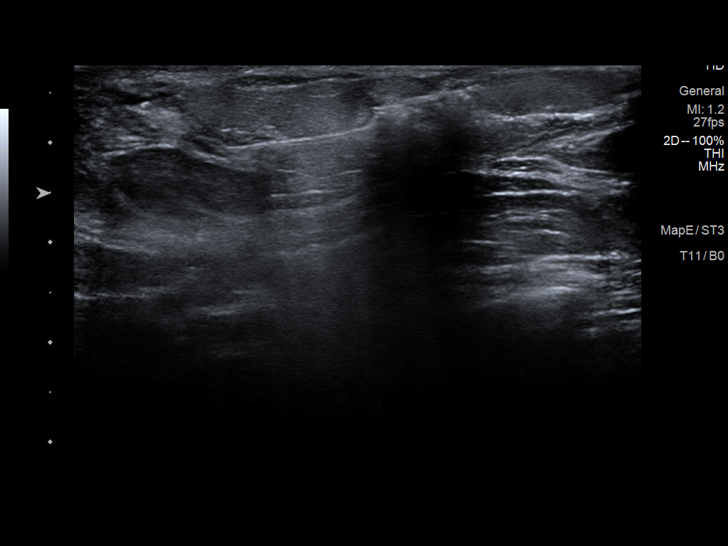
[im 12/13]
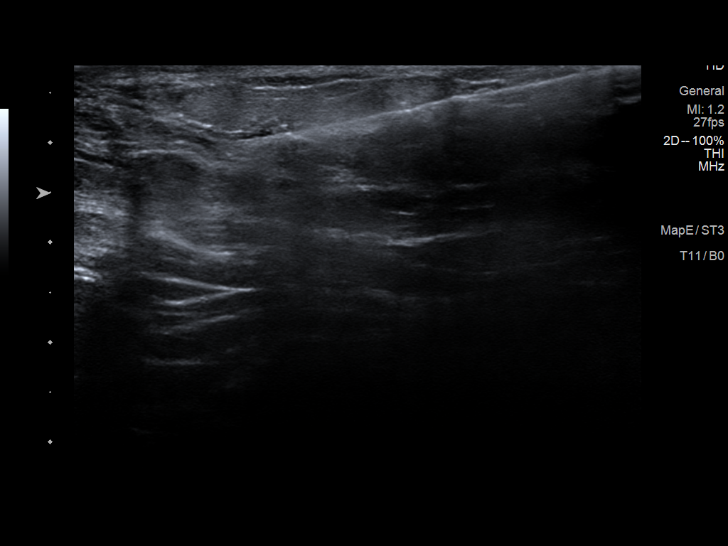
[im 13/13]
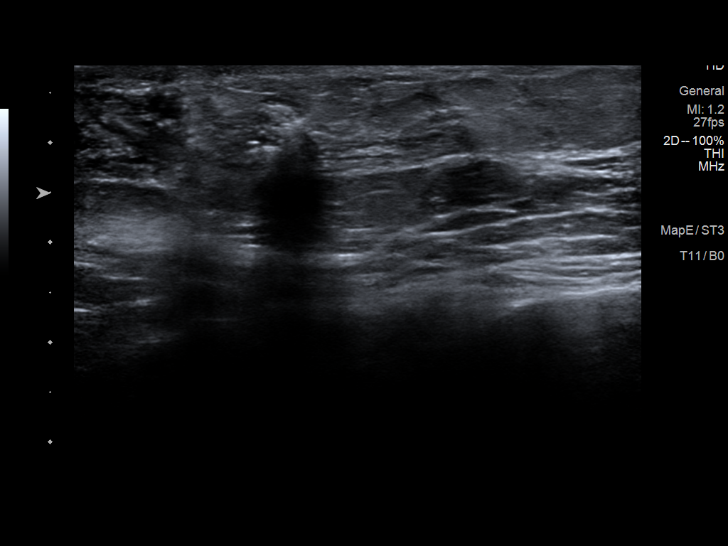

[13 of 13 positions shown; findings below may reference images not displayed]



Lesion quadrant: Upper inner

Using sterile technique and 1% Lidocaine as local anesthetic, under
direct ultrasound visualization, a 12 gauge Lozoya device was
used to perform biopsy of a left breast mass using a medial
approach. At the conclusion of the procedure a tissue marker clip
was deployed into the biopsy cavity. Follow up 2 view mammogram was
performed and dictated separately.
IMPRESSION: Ultrasound guided biopsy of a left breast mass. No apparent
complications.

## 2018-04-22 IMAGING — CT CT HEAD W/O CM
4 series · 17 of 47 positions shown, 19 images · non-contrast
Comparison: 11/09/2008

CLINICAL DATA: Head trauma.  Fall

EXAM:
CT HEAD WITHOUT CONTRAST
TECHNIQUE: Contiguous axial images were obtained from the base of the skull
through the vertex without intravenous contrast.

[Series 3: head without · axial · non-contrast · 0.41mm/px · z∈[-102,+18]mm · 7 of 32 slices shown, 9 images]
[im 4/32  brain]
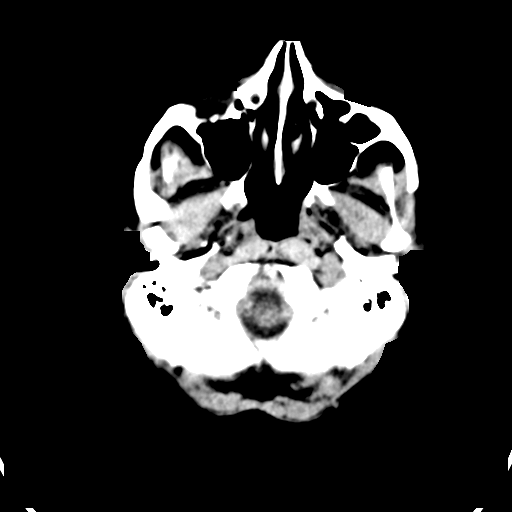
[im 4/32  bone]
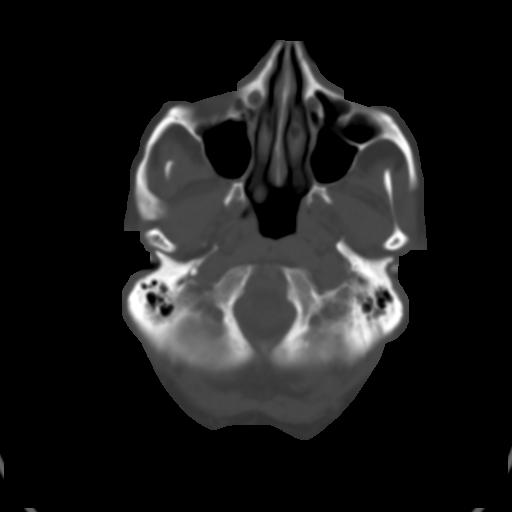
[im 8/32  brain]
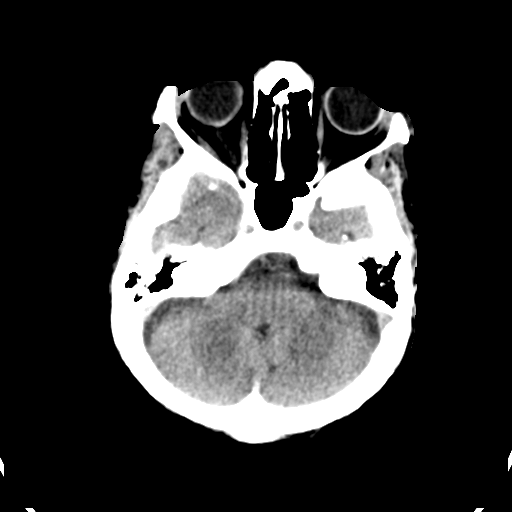
[im 12/32  brain]
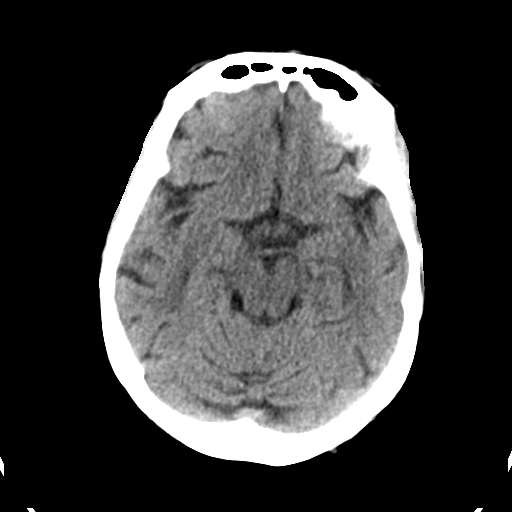
[im 16/32  brain]
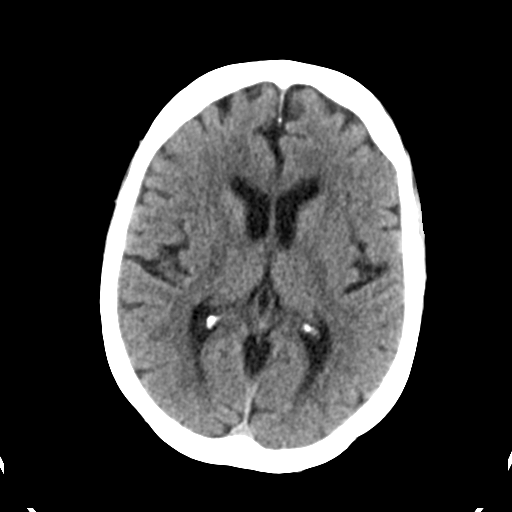
[im 20/32  brain]
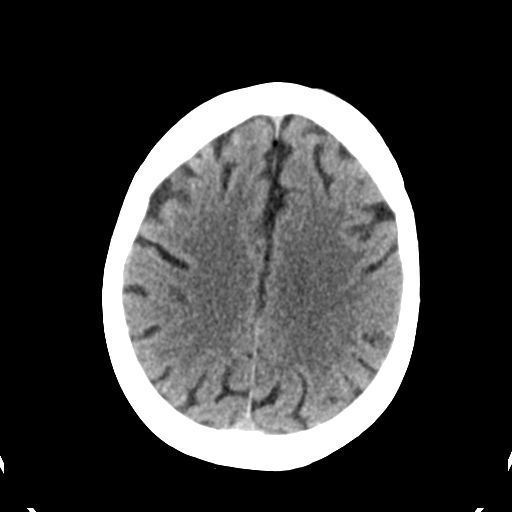
[im 20/32  bone]
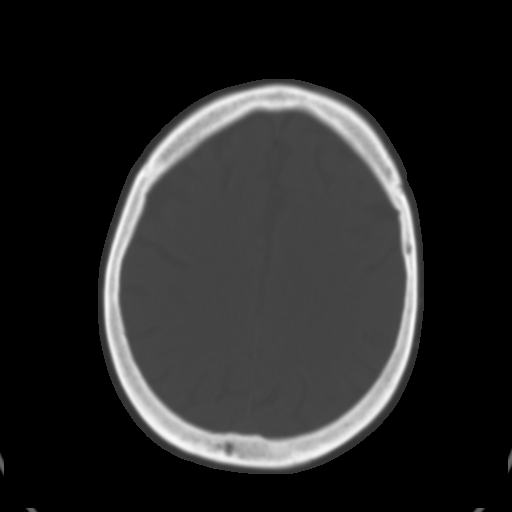
[im 24/32  brain]
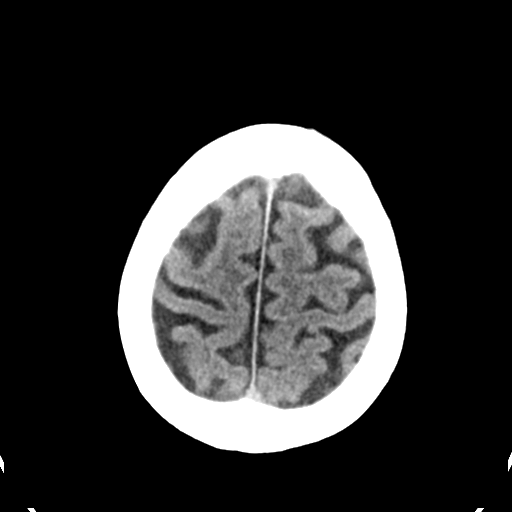
[im 28/32  brain]
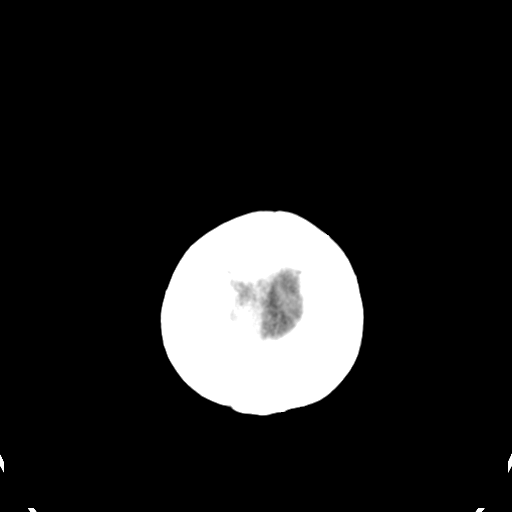

[Series 4: head bone · axial · 0.41mm/px · z∈[-103,-47]mm · 4 of 79 slices shown]
[im 8/79  bone]
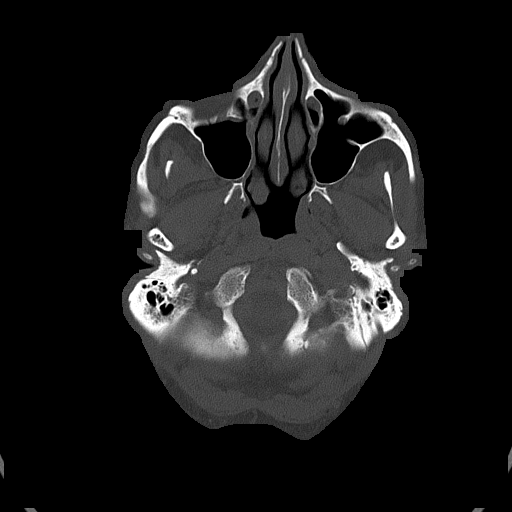
[im 16/79  bone]
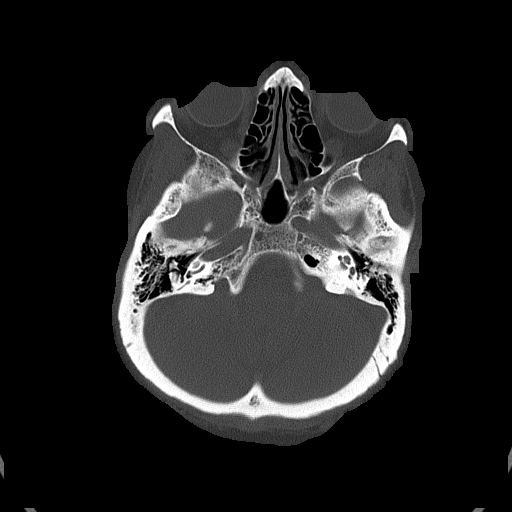
[im 24/79  bone]
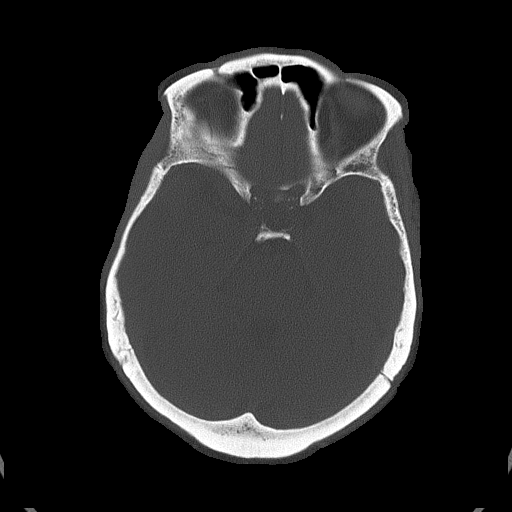
[im 36/79  bone]
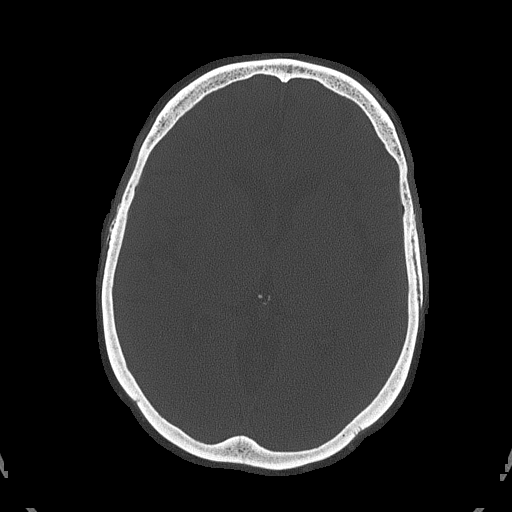

[Series 5: head without cor · coronal · non-contrast · 0.31mm/px · 3 of 66 slices shown]
[im 22/66  brain]
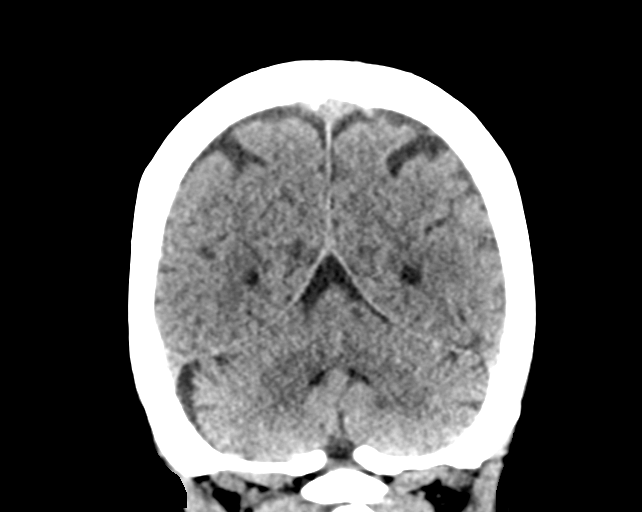
[im 29/66  brain]
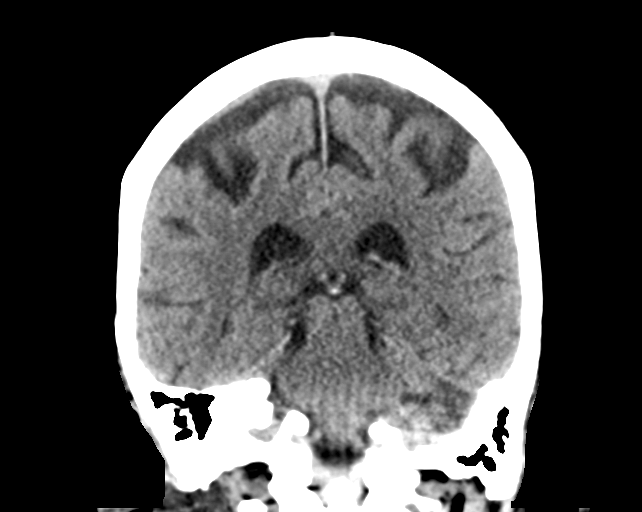
[im 37/66  brain]
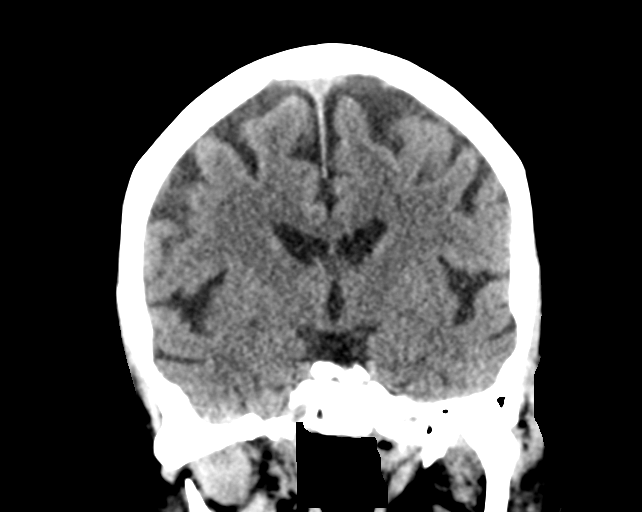

[Series 6: head without sag · sagittal · non-contrast · 0.39mm/px · 3 of 67 slices shown]
[im 23/67  brain]
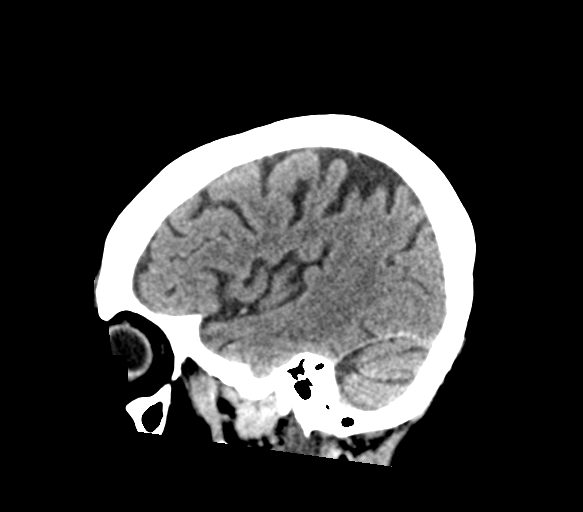
[im 34/67  brain]
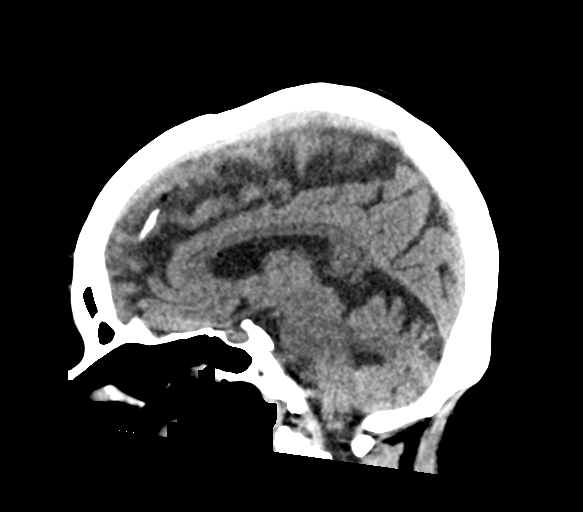
[im 45/67  brain]
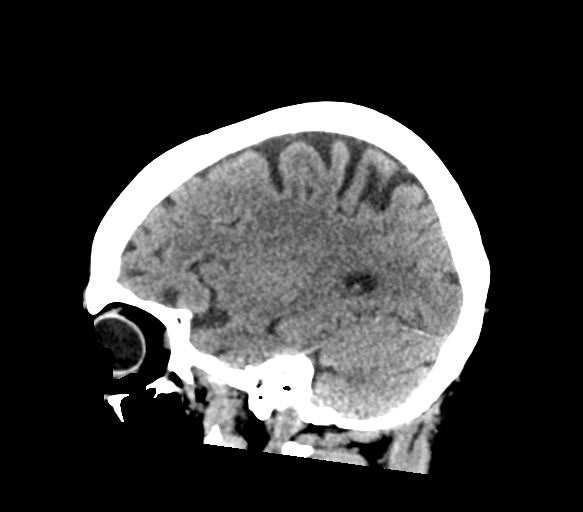

[17 of 47 positions shown; findings below may reference images not displayed]

FINDINGS: Brain: Mild atrophy. Negative for hydrocephalus. Negative for acute
infarct, hemorrhage, or mass lesion. No shift of the midline
structures

Vascular: Negative for hyperdense vessel

Skull: Negative for skull fracture

Sinuses/Orbits: Negative

Other: None
IMPRESSION: No acute abnormality.  Mild atrophy.

## 2018-09-20 ENCOUNTER — Other Ambulatory Visit: Payer: Self-pay | Admitting: Ophthalmology

## 2019-05-10 DIAGNOSIS — K922 Gastrointestinal hemorrhage, unspecified: Secondary | ICD-10-CM

## 2019-05-10 HISTORY — DX: Gastrointestinal hemorrhage, unspecified: K92.2

## 2019-05-12 ENCOUNTER — Ambulatory Visit
Admission: RE | Admit: 2019-05-12 | Discharge: 2019-05-12 | Disposition: A | Discharge status: Home / Self Care | Payer: Medicare Other | Source: Ambulatory Visit | Attending: Internal Medicine | Admitting: Internal Medicine

## 2019-05-12 ENCOUNTER — Other Ambulatory Visit: Payer: Self-pay

## 2019-05-12 ENCOUNTER — Other Ambulatory Visit: Payer: Self-pay | Admitting: Internal Medicine

## 2019-05-12 DIAGNOSIS — N61 Mastitis without abscess: Secondary | ICD-10-CM

## 2019-05-19 ENCOUNTER — Emergency Department (HOSPITAL_COMMUNITY): Payer: Medicare Other

## 2019-05-19 ENCOUNTER — Emergency Department (HOSPITAL_COMMUNITY)
Admission: EM | Admit: 2019-05-19 | Discharge: 2019-05-19 | Disposition: A | Discharge status: Home / Self Care | Payer: Medicare Other | Attending: Emergency Medicine | Admitting: Emergency Medicine

## 2019-05-19 ENCOUNTER — Other Ambulatory Visit: Payer: Self-pay

## 2019-05-19 ENCOUNTER — Encounter (HOSPITAL_COMMUNITY): Payer: Self-pay | Admitting: Emergency Medicine

## 2019-05-19 DIAGNOSIS — N179 Acute kidney failure, unspecified: Secondary | ICD-10-CM

## 2019-05-19 DIAGNOSIS — R112 Nausea with vomiting, unspecified: Secondary | ICD-10-CM | POA: Insufficient documentation

## 2019-05-19 DIAGNOSIS — N649 Disorder of breast, unspecified: Secondary | ICD-10-CM | POA: Diagnosis not present

## 2019-05-19 DIAGNOSIS — R519 Headache, unspecified: Secondary | ICD-10-CM

## 2019-05-19 DIAGNOSIS — R51 Headache: Secondary | ICD-10-CM | POA: Insufficient documentation

## 2019-05-19 DIAGNOSIS — R1011 Right upper quadrant pain: Secondary | ICD-10-CM

## 2019-05-19 DIAGNOSIS — I10 Essential (primary) hypertension: Secondary | ICD-10-CM | POA: Diagnosis not present

## 2019-05-19 DIAGNOSIS — Z79899 Other long term (current) drug therapy: Secondary | ICD-10-CM | POA: Insufficient documentation

## 2019-05-19 LAB — HEPATIC FUNCTION PANEL
ALT: 9 U/L (ref 0–44)
AST: 16 U/L (ref 15–41)
Albumin: 3.2 g/dL — ABNORMAL LOW (ref 3.5–5.0)
Alkaline Phosphatase: 87 U/L (ref 38–126)
Bilirubin, Direct: 0.2 mg/dL (ref 0.0–0.2)
Indirect Bilirubin: 0.5 mg/dL (ref 0.3–0.9)
Total Bilirubin: 0.7 mg/dL (ref 0.3–1.2)
Total Protein: 6.3 g/dL — ABNORMAL LOW (ref 6.5–8.1)

## 2019-05-19 LAB — LIPASE, BLOOD: Lipase: 20 U/L (ref 11–51)

## 2019-05-19 LAB — URINALYSIS, ROUTINE W REFLEX MICROSCOPIC
Bilirubin Urine: NEGATIVE
Glucose, UA: NEGATIVE mg/dL
Hgb urine dipstick: NEGATIVE
Ketones, ur: NEGATIVE mg/dL
Leukocytes,Ua: NEGATIVE
Nitrite: NEGATIVE
Protein, ur: NEGATIVE mg/dL
Specific Gravity, Urine: 1.02 (ref 1.005–1.030)
pH: 5 (ref 5.0–8.0)

## 2019-05-19 LAB — BASIC METABOLIC PANEL
Anion gap: 12 (ref 5–15)
BUN: 45 mg/dL — ABNORMAL HIGH (ref 8–23)
CO2: 23 mmol/L (ref 22–32)
Calcium: 9.3 mg/dL (ref 8.9–10.3)
Chloride: 106 mmol/L (ref 98–111)
Creatinine, Ser: 1.5 mg/dL — ABNORMAL HIGH (ref 0.44–1.00)
GFR calc Af Amer: 37 mL/min — ABNORMAL LOW (ref >60)
GFR calc non Af Amer: 32 mL/min — ABNORMAL LOW (ref >60)
Glucose, Bld: 141 mg/dL — ABNORMAL HIGH (ref 70–99)
Potassium: 3.6 mmol/L (ref 3.5–5.1)
Sodium: 141 mmol/L (ref 135–145)

## 2019-05-19 LAB — CBC
HCT: 30 % — ABNORMAL LOW (ref 36.0–46.0)
Hemoglobin: 9.9 g/dL — ABNORMAL LOW (ref 12.0–15.0)
MCH: 29.6 pg (ref 26.0–34.0)
MCHC: 33 g/dL (ref 30.0–36.0)
MCV: 89.8 fL (ref 80.0–100.0)
Platelets: 263 10*3/uL (ref 150–400)
RBC: 3.34 MIL/uL — ABNORMAL LOW (ref 3.87–5.11)
RDW: 14.5 % (ref 11.5–15.5)
WBC: 10.5 10*3/uL (ref 4.0–10.5)
nRBC: 0 % (ref 0.0–0.2)

## 2019-05-19 LAB — CBG MONITORING, ED: Glucose-Capillary: 128 mg/dL — ABNORMAL HIGH (ref 70–99)

## 2019-05-19 MED ORDER — METOCLOPRAMIDE HCL 10 MG PO TABS: 10.0000 mg | ORAL_TABLET | Freq: Four times a day (QID) | ORAL | 0 refills | Status: DC | PRN

## 2019-05-19 MED ORDER — SODIUM CHLORIDE 0.9 % IV BOLUS
1000.0000 mL | Freq: Once | INTRAVENOUS | Status: AC
  Administered 2019-05-19: 1000 mL via INTRAVENOUS

## 2019-05-19 MED ORDER — IOHEXOL 300 MG/ML  SOLN
75.0000 mL | Freq: Once | INTRAMUSCULAR | Status: AC | PRN
  Administered 2019-05-19: 75 mL via INTRAVENOUS

## 2019-05-19 MED ORDER — DIPHENHYDRAMINE HCL 50 MG/ML IJ SOLN
12.5000 mg | Freq: Once | INTRAMUSCULAR | Status: AC
  Administered 2019-05-19: 20:00:00 12.5 mg via INTRAVENOUS
  Filled 2019-05-19: qty 1

## 2019-05-19 MED ORDER — METOCLOPRAMIDE HCL 5 MG/ML IJ SOLN
10.0000 mg | Freq: Once | INTRAMUSCULAR | Status: AC
  Administered 2019-05-19: 10 mg via INTRAVENOUS
  Filled 2019-05-19: qty 2

## 2019-05-19 NOTE — ED Provider Notes (Signed)
[facility] EMERGENCY DEPARTMENT Provider Note   CSN: [id number] Arrival date & time: 05/19/19  1349     History   Chief Complaint Chief Complaint  Patient presents with   Headache    HPI Abigail Shannon is a 83 y.o. female hx of HTN, here presenting with headache, abdominal pain.  Patient been having intermittent headaches for the last several weeks.  States that today her headache got worse in the morning and she is not feeling well.  She also has some epigastric pain and had some vomiting as well.  She had a abnormal mammogram and is currently on doxycycline and is supposed to see her oncologist for repeat mammogram.  She denies any worsening breast pain or any chest pain or shortness of breath. Denies any numbness or trouble speaking.      The history is provided by the patient.    Past Medical History:  Diagnosis Date   Hypertension     Patient Active Problem List   Diagnosis Date Noted   Hypertensive urgency 04/12/2018    Past Surgical History:  Procedure Laterality Date   BREAST EXCISIONAL BIOPSY Left    2 benign breast biopsies years ago   BREAST SURGERY     KNEE SURGERY       OB History   No obstetric history on file.      Home Medications    Prior to Admission medications   Medication Sig Start Date End Date Taking? Authorizing Provider  amLODipine (NORVASC) 10 MG tablet Take 1 tablet (10 mg total) by mouth daily. 03/31/17   Gareth Morgan, MD  cephALEXin (KEFLEX) 500 MG capsule Take 1 capsule (500 mg total) by mouth 4 (four) times daily. 03/28/17   Virgel Manifold, MD  diphenhydrAMINE (BENADRYL) 25 MG tablet Take 1 tablet (25 mg total) by mouth every 6 (six) hours. 04/12/18   Varney Biles, MD  hydrochlorothiazide (HYDRODIURIL) 25 MG tablet Take 1 tablet (25 mg total) by mouth daily. 04/12/18   Varney Biles, MD  HYDROcodone-acetaminophen (NORCO/VICODIN) 5-325 MG tablet Take 1 tablet by mouth every 4 (four) hours as needed.  03/28/17   Virgel Manifold, MD  losartan (COZAAR) 100 MG tablet Take 100 mg by mouth daily.    [provider]  naproxen sodium (ANAPROX) 220 MG tablet Take 220 mg by mouth every 8 (eight) hours as needed (knee pain).    [provider]  predniSONE (DELTASONE) 10 MG tablet Take 5 tablets (50 mg total) by mouth daily. 04/12/18   Varney Biles, MD    Family History No family history on file.  Social History Social History   Tobacco Use   Smoking status: Never Smoker   Smokeless tobacco: Never Used  Substance Use Topics   Alcohol use: No   Drug use: No     Allergies   Aspirin   Review of Systems Review of Systems  Neurological: Positive for headaches.  All other systems reviewed and are negative.    Physical Exam Updated Vital Signs BP 119/62 (BP Location: Right Arm)    Pulse 68    Temp 98.5 F (36.9 C) (Oral)    Resp 11    Ht 5\' 4"  (1.626 m)    Wt 69.9 kg    SpO2 97%    BMI 26.43 kg/m   Physical Exam Vitals signs and nursing note reviewed.  HENT:     Head: Normocephalic.     Mouth/Throat:     Mouth: Mucous membranes are  moist.  Eyes:     Extraocular Movements: Extraocular movements intact.     Pupils: Pupils are equal, round, and reactive to light.  Neck:     Musculoskeletal: Normal range of motion and neck supple.  Cardiovascular:     Rate and Rhythm: Normal rate and regular rhythm.  Pulmonary:     Effort: Pulmonary effort is normal.     Breath sounds: Normal breath sounds.     Comments: L breast with some redness (she is on doxycycline), no fluctuance or nipple discharge  Abdominal:     General: Bowel sounds are normal.     Palpations: Abdomen is soft.     Comments: Mild epigastric tenderness, no rebound   Musculoskeletal: Normal range of motion.  Skin:    General: Skin is warm.  Neurological:     Mental Status: She is alert and oriented to person, place, and time.     Cranial Nerves: No cranial nerve deficit or facial asymmetry.      Sensory: No sensory deficit.     Motor: No weakness.     Coordination: Romberg sign negative. Coordination normal.  Psychiatric:        Mood and Affect: Mood normal.      ED Treatments / Results  Labs (all labs ordered are listed, but only abnormal results are displayed) Labs Reviewed  BASIC METABOLIC PANEL - Abnormal; Notable for the following components:      Result Value   Glucose, Bld 141 (*)    BUN 45 (*)    Creatinine, Ser 1.50 (*)    GFR calc non Af Amer 32 (*)    GFR calc Af Amer 37 (*)    All other components within normal limits  CBC - Abnormal; Notable for the following components:   RBC 3.34 (*)    Hemoglobin 9.9 (*)    HCT 30.0 (*)    All other components within normal limits  HEPATIC FUNCTION PANEL - Abnormal; Notable for the following components:   Total Protein 6.3 (*)    Albumin 3.2 (*)    All other components within normal limits  CBG MONITORING, ED - Abnormal; Notable for the following components:   Glucose-Capillary 128 (*)    All other components within normal limits  LIPASE, BLOOD  URINALYSIS, ROUTINE W REFLEX MICROSCOPIC    EKG EKG Interpretation  Date/Time:  Thursday May 19 2019 19:19:51 EDT Ventricular Rate:  68 PR Interval:    QRS Duration: 114 QT Interval:  458 QTC Calculation: 488 R Axis:   78 Text Interpretation:  Sinus rhythm Ventricular premature complex Borderline intraventricular conduction delay Borderline prolonged QT interval No significant change since last tracing Confirmed by Wandra Arthurs [phone]) on 05/19/2019 7:34:26 PM   Radiology Ct Head Wo Contrast  Result Date: 05/19/2019 CLINICAL DATA:  Headache EXAM: CT HEAD WITHOUT CONTRAST TECHNIQUE: Contiguous axial images were obtained from the base of the skull through the vertex without intravenous contrast. COMPARISON:  04/18/2017 FINDINGS: Brain: Mild age related volume loss. No acute intracranial abnormality. Specifically, no hemorrhage, hydrocephalus, mass lesion, acute  infarction, or significant intracranial injury. Vascular: No hyperdense vessel or unexpected calcification. Skull: No acute calvarial abnormality. Sinuses/Orbits: Visualized paranasal sinuses and mastoids clear. Orbital soft tissues unremarkable. Other: None IMPRESSION: No acute intracranial abnormality. Electronically Signed   By: Rolm Baptise M.D.   On: 05/19/2019 20:51   Ct Abdomen Pelvis W Contrast  Result Date: 05/19/2019 CLINICAL DATA:  Abdominal distension EXAM: CT ABDOMEN AND PELVIS WITH  CONTRAST TECHNIQUE: Multidetector CT imaging of the abdomen and pelvis was performed using the standard protocol following bolus administration of intravenous contrast. CONTRAST:  57mL OMNIPAQUE IOHEXOL 300 MG/ML  SOLN COMPARISON:  None. FINDINGS: The study is somewhat limited due to patient motion. Lower chest: The visualized heart size within normal limits. No pericardial fluid/thickening. No hiatal hernia. There is a 7 mm nodule seen in the anterior left lung base. There is asymmetric skin thickening over the left breast. Hepatobiliary: The liver is normal in density without focal abnormality.The main portal vein is patent. There is question of mild fat stranding changes seen around the gallbladder neck. No definite pericholecystic fluid or layering calculi are seen. Pancreas: Unremarkable. No pancreatic ductal dilatation or surrounding inflammatory changes. Spleen: Normal in size without focal abnormality. Adrenals/Urinary Tract: Both adrenal glands appear normal. There are bilateral low-density lesions seen within both kidneys the largest measuring 2 cm in the upper pole of the left kidney. No hydronephrosis is seen. Stomach/Bowel: The stomach, small bowel, and colon are normal in appearance. No inflammatory changes, wall thickening, or obstructive findings. Vascular/Lymphatic: There are no enlarged mesenteric, retroperitoneal, or pelvic lymph nodes. Scattered aortic atherosclerotic calcifications are seen without  aneurysmal dilatation. Reproductive: The uterus and adnexa are unremarkable. Other: No evidence of abdominal wall mass or hernia. Musculoskeletal: No acute or significant osseous findings. IMPRESSION: 1. Asymmetric left breast skin thickening as on the recent ultrasound. 2. 7 mm nodule seen in the anterior left lung base. Non-contrast chest CT at 6-12 months is recommended. If the nodule is stable at time of repeat CT, then future CT at 18-24 months (from today's scan) is considered optional for low-risk patients, but is recommended for high-risk patients. This recommendation follows the consensus statement: Guidelines for Management of Incidental Pulmonary Nodules Detected on CT Images: From the Fleischner Society 2017; Radiology 2017; 284:228-243. 3. Somewhat limited due to patient motion, however there is question of mild fat stranding changes seen around the gallbladder neck. Please correlate with the patient's clinical laboratory exam. If clinical concern remains, would recommend right upper quadrant ultrasound. Electronically Signed   By: Prudencio Pair M.D.   On: 05/19/2019 21:13    Procedures Procedures (including critical care time)  Medications Ordered in ED Medications  sodium chloride 0.9 % bolus 1,000 mL (1,000 mLs Intravenous New Bag/Given 05/19/19 2022)  metoCLOPramide (REGLAN) injection 10 mg (10 mg Intravenous Given 05/19/19 2022)  diphenhydrAMINE (BENADRYL) injection 12.5 mg (12.5 mg Intravenous Given 05/19/19 2022)  iohexol (OMNIPAQUE) 300 MG/ML solution 75 mL (75 mLs Intravenous Contrast Given 05/19/19 2047)     Initial Impression / Assessment and Plan / ED Course  I have reviewed the triage vital signs and the nursing notes.  Pertinent labs & imaging results that were available during my care of the patient were reviewed by me and considered in my medical decision making (see chart for details).       Abigail Shannon is a 83 y.o. female here with headache and abdominal pain. She  has abnormal mammogram and states that she has a follow up with oncology. Consider migraines vs mets to abdomen vs SBO. Will get labs, CT head/ Ct ab/pel. Will give migraine cocktail and reassess.    10:32 PM Labs showed mild AKI with Cr 1.5. CT ab/pel showed known breast thickening. ? Fat stranding around gallbladder neck. RUQ US showed no gallstones or dilated CBD. She has follow up with breast cancer already and is aware of the abnormal mammogram. Likely mild  gastroenteritis causing current symptoms. Will dc home with reglan   Final Clinical Impressions(s) / ED Diagnoses   Final diagnoses:  RUQ pain    ED Discharge Orders    None       Drenda Freeze, MD 05/19/19 2233

## 2019-05-19 NOTE — ED Notes (Signed)
Patient transported to Ultrasound 

## 2019-05-19 NOTE — ED Notes (Signed)
Urine Culture sent to Main Lab with UA 

## 2019-05-19 NOTE — ED Triage Notes (Signed)
Patient states around 11:30 today she had a sudden onset of feeling very shaky and became sweaty. Patient states she is currently taking doxycycline for her left breast. No neuro deficits noted  In triage. Patient c/o headaches intermittently.

## 2019-05-19 NOTE — ED Notes (Signed)
Pt discharged from ED; instructions provided and scripts given; Pt encouraged to return to ED if symptoms worsen and to f/u with PCP; Pt verbalized understanding of all instructions 

## 2019-05-19 NOTE — Discharge Instructions (Addendum)
Stay hydrated.   Take reglan for nausea   Repeat kidney function in a week   Follow up with breast center   Return to ER if you have worse abdominal pain, vomiting, headaches, fever

## 2019-05-26 ENCOUNTER — Encounter (HOSPITAL_COMMUNITY): Payer: Self-pay | Admitting: Emergency Medicine

## 2019-05-26 ENCOUNTER — Other Ambulatory Visit: Payer: Medicare Other

## 2019-05-26 ENCOUNTER — Other Ambulatory Visit: Payer: Self-pay

## 2019-05-26 ENCOUNTER — Inpatient Hospital Stay (HOSPITAL_COMMUNITY)
Admission: EM | Admit: 2019-05-26 | Discharge: 2019-05-28 | DRG: 378 | Disposition: A | Discharge status: Home Health | Payer: Medicare Other | Attending: Internal Medicine | Admitting: Internal Medicine

## 2019-05-26 DIAGNOSIS — E785 Hyperlipidemia, unspecified: Secondary | ICD-10-CM | POA: Diagnosis present

## 2019-05-26 DIAGNOSIS — T39395A Adverse effect of other nonsteroidal anti-inflammatory drugs [NSAID], initial encounter: Secondary | ICD-10-CM | POA: Diagnosis present

## 2019-05-26 DIAGNOSIS — Z20828 Contact with and (suspected) exposure to other viral communicable diseases: Secondary | ICD-10-CM | POA: Diagnosis present

## 2019-05-26 DIAGNOSIS — I10 Essential (primary) hypertension: Secondary | ICD-10-CM | POA: Diagnosis present

## 2019-05-26 DIAGNOSIS — K254 Chronic or unspecified gastric ulcer with hemorrhage: Principal | ICD-10-CM | POA: Diagnosis present

## 2019-05-26 DIAGNOSIS — K922 Gastrointestinal hemorrhage, unspecified: Secondary | ICD-10-CM | POA: Diagnosis present

## 2019-05-26 DIAGNOSIS — D62 Acute posthemorrhagic anemia: Secondary | ICD-10-CM | POA: Diagnosis present

## 2019-05-26 DIAGNOSIS — N179 Acute kidney failure, unspecified: Secondary | ICD-10-CM | POA: Diagnosis present

## 2019-05-26 DIAGNOSIS — Z79899 Other long term (current) drug therapy: Secondary | ICD-10-CM

## 2019-05-26 DIAGNOSIS — Z886 Allergy status to analgesic agent status: Secondary | ICD-10-CM | POA: Diagnosis not present

## 2019-05-26 DIAGNOSIS — K449 Diaphragmatic hernia without obstruction or gangrene: Secondary | ICD-10-CM | POA: Diagnosis present

## 2019-05-26 DIAGNOSIS — K921 Melena: Secondary | ICD-10-CM

## 2019-05-26 DIAGNOSIS — R109 Unspecified abdominal pain: Secondary | ICD-10-CM

## 2019-05-26 DIAGNOSIS — Z91030 Bee allergy status: Secondary | ICD-10-CM

## 2019-05-26 DIAGNOSIS — I951 Orthostatic hypotension: Secondary | ICD-10-CM | POA: Diagnosis present

## 2019-05-26 DIAGNOSIS — D649 Anemia, unspecified: Secondary | ICD-10-CM

## 2019-05-26 LAB — COMPREHENSIVE METABOLIC PANEL
ALT: 9 U/L (ref 0–44)
AST: 14 U/L — ABNORMAL LOW (ref 15–41)
Albumin: 3.2 g/dL — ABNORMAL LOW (ref 3.5–5.0)
Alkaline Phosphatase: 103 U/L (ref 38–126)
Anion gap: 13 (ref 5–15)
BUN: 41 mg/dL — ABNORMAL HIGH (ref 8–23)
CO2: 22 mmol/L (ref 22–32)
Calcium: 8.8 mg/dL — ABNORMAL LOW (ref 8.9–10.3)
Chloride: 108 mmol/L (ref 98–111)
Creatinine, Ser: 2.39 mg/dL — ABNORMAL HIGH (ref 0.44–1.00)
GFR calc Af Amer: 21 mL/min — ABNORMAL LOW (ref >60)
GFR calc non Af Amer: 18 mL/min — ABNORMAL LOW (ref >60)
Glucose, Bld: 126 mg/dL — ABNORMAL HIGH (ref 70–99)
Potassium: 3.2 mmol/L — ABNORMAL LOW (ref 3.5–5.1)
Sodium: 143 mmol/L (ref 135–145)
Total Bilirubin: 0.8 mg/dL (ref 0.3–1.2)
Total Protein: 6.3 g/dL — ABNORMAL LOW (ref 6.5–8.1)

## 2019-05-26 LAB — CBC WITH DIFFERENTIAL/PLATELET
Abs Immature Granulocytes: 0.03 10*3/uL (ref 0.00–0.07)
Basophils Absolute: 0.1 10*3/uL (ref 0.0–0.1)
Basophils Relative: 1 %
Eosinophils Absolute: 0.1 10*3/uL (ref 0.0–0.5)
Eosinophils Relative: 1 %
HCT: 21.4 % — ABNORMAL LOW (ref 36.0–46.0)
Hemoglobin: 7.1 g/dL — ABNORMAL LOW (ref 12.0–15.0)
Immature Granulocytes: 0 %
Lymphocytes Relative: 17 %
Lymphs Abs: 1.6 10*3/uL (ref 0.7–4.0)
MCH: 31 pg (ref 26.0–34.0)
MCHC: 33.2 g/dL (ref 30.0–36.0)
MCV: 93.4 fL (ref 80.0–100.0)
Monocytes Absolute: 0.6 10*3/uL (ref 0.1–1.0)
Monocytes Relative: 7 %
Neutro Abs: 7 10*3/uL (ref 1.7–7.7)
Neutrophils Relative %: 74 %
Platelets: 212 10*3/uL (ref 150–400)
RBC: 2.29 MIL/uL — ABNORMAL LOW (ref 3.87–5.11)
RDW: 15.9 % — ABNORMAL HIGH (ref 11.5–15.5)
WBC: 9.3 10*3/uL (ref 4.0–10.5)
nRBC: 0.2 % (ref 0.0–0.2)

## 2019-05-26 LAB — PROTIME-INR
INR: 1.3 — ABNORMAL HIGH (ref 0.8–1.2)
Prothrombin Time: 15.7 seconds — ABNORMAL HIGH (ref 11.4–15.2)

## 2019-05-26 LAB — PREPARE RBC (CROSSMATCH)

## 2019-05-26 LAB — ABO/RH: ABO/RH(D): O POS

## 2019-05-26 LAB — POC OCCULT BLOOD, ED: Fecal Occult Bld: POSITIVE — AB

## 2019-05-26 LAB — SARS CORONAVIRUS 2 BY RT PCR (HOSPITAL ORDER, PERFORMED IN ~~LOC~~ HOSPITAL LAB): SARS Coronavirus 2: NEGATIVE

## 2019-05-26 MED ORDER — SODIUM CHLORIDE 0.9 % IV SOLN
80.0000 mg | Freq: Once | INTRAVENOUS | Status: AC
  Administered 2019-05-26: 80 mg via INTRAVENOUS
  Filled 2019-05-26: qty 80

## 2019-05-26 MED ORDER — ONDANSETRON HCL 4 MG/2ML IJ SOLN
4.0000 mg | Freq: Once | INTRAMUSCULAR | Status: AC
  Administered 2019-05-26: 4 mg via INTRAVENOUS
  Filled 2019-05-26: qty 2

## 2019-05-26 MED ORDER — SODIUM CHLORIDE 0.9 % IV BOLUS
1000.0000 mL | Freq: Once | INTRAVENOUS | Status: AC
  Administered 2019-05-26: 1000 mL via INTRAVENOUS

## 2019-05-26 MED ORDER — ONDANSETRON HCL 4 MG PO TABS: 4.0000 mg | ORAL_TABLET | Freq: Four times a day (QID) | ORAL | Status: DC | PRN

## 2019-05-26 MED ORDER — ONDANSETRON HCL 4 MG/2ML IJ SOLN: 4.0000 mg | Freq: Four times a day (QID) | INTRAMUSCULAR | Status: DC | PRN

## 2019-05-26 MED ORDER — SODIUM CHLORIDE 0.9 % IV SOLN
8.0000 mg/h | INTRAVENOUS | Status: DC
  Administered 2019-05-26 – 2019-05-28 (×4): 8 mg/h via INTRAVENOUS
  Filled 2019-05-26 (×7): qty 80

## 2019-05-26 MED ORDER — ACETAMINOPHEN 650 MG RE SUPP: 650.0000 mg | Freq: Four times a day (QID) | RECTAL | Status: DC | PRN

## 2019-05-26 MED ORDER — SODIUM CHLORIDE 0.9 % IV SOLN
INTRAVENOUS | Status: DC
  Administered 2019-05-26: 23:00:00 via INTRAVENOUS

## 2019-05-26 MED ORDER — SODIUM CHLORIDE 0.9 % IV SOLN
10.0000 mL/h | Freq: Once | INTRAVENOUS | Status: AC
  Administered 2019-05-26: 10 mL/h via INTRAVENOUS

## 2019-05-26 MED ORDER — ACETAMINOPHEN 325 MG PO TABS: 650.0000 mg | ORAL_TABLET | Freq: Four times a day (QID) | ORAL | Status: DC | PRN

## 2019-05-26 NOTE — H&P (Signed)
History and Physical    [name] [id number] DOB: [birth year] DOA: 05/26/2019  PCP: Haywood Pao, MD  Patient coming from: Home.  Chief Complaint: Abnormal blood work.  HPI: ANNELLA Shannon is a 83 y.o. female with history of hypertension hyperlipidemia arthritis on Aleve had come to the ER on September 10 with complaints of abdominal pain dizziness and headache at that time CT abdomen and CT head and ultrasound were done which largely were unremarkable and patient was discharged home.  Headache after which has largely resolved but started having again headache this morning.  Patient also has been noticing black stools over the last 1 week.  Patient was taken to her primary care physician by her daughter and over the had blood work done which showed low hemoglobin and was advised to come to the ER.  Patient feels dizzy on standing.  ED Course: In the ER on arrival patient's blood pressure was in the low 90s and at times had dropped below 90s.  Was given fluid bolus following which blood pressure improved.  Stool for occult blood was positive hemoglobin was 7.1.  Creatinine 2.3 and patient's creatinine in August 2019 was 0.9 which was normal and hemoglobin was 13.4 in August.  Patient was started on Protonix infusion 1 unit of PRBC transfusion ordered and also had given another liter bolus because of the low normal blood pressure which improved with a bolus.  On-call gastroenterologist Dr. Therisa Doyne has been consulted and patient admitted for further work-up of acute GI bleed.  Acute abdominal series unremarkable.  Review of Systems: As per HPI, rest all negative.   Past Medical History:  Diagnosis Date  . Hypertension     Past Surgical History:  Procedure Laterality Date  . BREAST EXCISIONAL BIOPSY Left    2 benign breast biopsies years ago  . BREAST SURGERY    . KNEE SURGERY       reports that she has never smoked. She has never used smokeless tobacco. She reports that she  does not drink alcohol or use drugs.  Allergies  Allergen Reactions  . Aspirin Anaphylaxis and Swelling  . Bee Venom Anaphylaxis and Swelling    Family History  Family history unknown: Yes    Prior to Admission medications   Medication Sig Start Date End Date Taking? Authorizing Provider  amLODipine (NORVASC) 5 MG tablet Take 5 mg by mouth daily.  03/02/19  Yes [provider]  atorvastatin (LIPITOR) 40 MG tablet Take 40 mg by mouth daily.   Yes [provider]  Cholecalciferol (VITAMIN D3) 50 MCG (2000 UT) TABS Take 2,000 Units by mouth daily with breakfast.   Yes [provider]  diphenhydrAMINE (BENADRYL) 25 MG tablet Take 1 tablet (25 mg total) by mouth every 6 (six) hours. Patient taking differently: Take 25 mg by mouth every 6 (six) hours as needed for itching or allergies (or allergic reactions).  04/12/18  Yes Varney Biles, MD  hydrochlorothiazide (HYDRODIURIL) 25 MG tablet Take 1 tablet (25 mg total) by mouth daily. 04/12/18  Yes Nanavati, Ankit, MD  losartan (COZAAR) 100 MG tablet Take 100 mg by mouth daily.   Yes [provider]  metoCLOPramide (REGLAN) 10 MG tablet Take 1 tablet (10 mg total) by mouth every 6 (six) hours as needed for nausea (nausea/headache). 05/19/19  Yes Drenda Freeze, MD  naproxen sodium (ANAPROX) 220 MG tablet Take 220 mg by mouth every 8 (eight) hours as needed (knee pain).   Yes  [provider]  amLODipine (NORVASC) 10 MG tablet Take 1 tablet (10 mg total) by mouth daily. Patient not taking: Reported on 05/26/2019 03/31/17   Gareth Morgan, MD  cephALEXin (KEFLEX) 500 MG capsule Take 1 capsule (500 mg total) by mouth 4 (four) times daily. Patient not taking: Reported on 05/26/2019 03/28/17   Virgel Manifold, MD  HYDROcodone-acetaminophen (NORCO/VICODIN) 5-325 MG tablet Take 1 tablet by mouth every 4 (four) hours as needed. Patient not taking: Reported on 05/26/2019 03/28/17   Virgel Manifold, MD  predniSONE  (DELTASONE) 10 MG tablet Take 5 tablets (50 mg total) by mouth daily. Patient not taking: Reported on 05/26/2019 04/12/18   Varney Biles, MD    Physical Exam: Constitutional: Moderately built and nourished. Vitals:   05/26/19 2145 05/26/19 2200 05/26/19 2215 05/26/19 2230  BP: 91/60 (!) 78/68 96/65 (!) 84/69  Pulse: 74 76 80 80  Resp: 20 (!) 22 20 16   Temp:      TempSrc:      SpO2: 97% 98% 100% 96%  Weight:      Height:       Eyes: Anicteric no pallor. ENMT: No discharge from the ears eyes nose or mouth. Neck: No mass felt.  No neck rigidity. Respiratory: No rhonchi or crepitations. Cardiovascular: S1-S2 heard. Abdomen: Soft nontender bowel sounds present. Musculoskeletal: No edema. Skin: No rash. Neurologic: Alert awake oriented to time place and person.  Moves all extremities. Psychiatric: Appears normal per normal affect.   Labs on Admission: I have personally reviewed following labs and imaging studies  CBC: Recent Labs  Lab 05/26/19 1616  WBC 9.3  NEUTROABS 7.0  HGB 7.1*  HCT 21.4*  MCV 93.4  PLT 99991111   Basic Metabolic Panel: Recent Labs  Lab 05/26/19 1616  NA 143  K 3.2*  CL 108  CO2 22  GLUCOSE 126*  BUN 41*  CREATININE 2.39*  CALCIUM 8.8*   GFR: Estimated Creatinine Clearance: 17.2 mL/min (A) (by C-G formula based on SCr of 2.39 mg/dL (H)). Liver Function Tests: Recent Labs  Lab 05/26/19 1616  AST 14*  ALT 9  ALKPHOS 103  BILITOT 0.8  PROT 6.3*  ALBUMIN 3.2*   No results for input(s): LIPASE, AMYLASE in the last 168 hours. No results for input(s): AMMONIA in the last 168 hours. Coagulation Profile: Recent Labs  Lab 05/26/19 1903  INR 1.3*   Cardiac Enzymes: No results for input(s): CKTOTAL, CKMB, CKMBINDEX, TROPONINI in the last 168 hours. BNP (last 3 results) No results for input(s): PROBNP in the last 8760 hours. HbA1C: No results for input(s): HGBA1C in the last 72 hours. CBG: No results for input(s): GLUCAP in the last 168  hours. Lipid Profile: No results for input(s): CHOL, HDL, LDLCALC, TRIG, CHOLHDL, LDLDIRECT in the last 72 hours. Thyroid Function Tests: No results for input(s): TSH, T4TOTAL, FREET4, T3FREE, THYROIDAB in the last 72 hours. Anemia Panel: No results for input(s): VITAMINB12, FOLATE, FERRITIN, TIBC, IRON, RETICCTPCT in the last 72 hours. Urine analysis:    Component Value Date/Time   COLORURINE YELLOW 05/19/2019 2204   APPEARANCEUR HAZY (A) 05/19/2019 2204   LABSPEC 1.020 05/19/2019 2204   PHURINE 5.0 05/19/2019 2204   GLUCOSEU NEGATIVE 05/19/2019 2204   HGBUR NEGATIVE 05/19/2019 2204   BILIRUBINUR NEGATIVE 05/19/2019 2204   KETONESUR NEGATIVE 05/19/2019 2204   PROTEINUR NEGATIVE 05/19/2019 2204   UROBILINOGEN 2.0 (H) 12/26/2010 1007   NITRITE NEGATIVE 05/19/2019 2204   LEUKOCYTESUR NEGATIVE 05/19/2019 2204   Sepsis Labs: @LABRCNTIP (procalcitonin:4,lacticidven:4) )No  results found for this or any previous visit (from the past 240 hour(s)).   Radiological Exams on Admission: No results found.   Assessment/Plan Principal Problem:   Acute GI bleeding Active Problems:   Acute blood loss anemia   ARF (acute renal failure) (HCC)    1. Acute GI bleeding -given the melanotic stool and patient being on NSAIDs patient is likely having upper GI bleed.  Keep patient n.p.o. Protonix infusion 1 unit of PRBC transfusion has been ordered recheck hemoglobin after transfusion and Dr. Therisa Doyne on-call gastroenterologist has been consulted likely will need EGD. 2. Acute blood loss anemia follow CBC after transfusion. 3. Acute renal failure likely from hypotension patient's use of NSAID's and blood loss.  I think patient's creatinine will improve with transfusion and fluids.  Follow metabolic panel. 4. Hypertension presently blood pressure in the low normal.  Holding antihypertensives. 5. History of hyperlipidemia presently n.p.o. 6. History of headache likely could be from anemia which we need to  reassess after transfusion if is getting better.  CT head recently done was unremarkable.  Presently patient is headache free.  Given that patient has acute GI bleed with hypotensive and on presentation eating fluid resuscitation will need close monitoring for any further deterioration and will need at least more than 2 midnight stays in inpatient status.   DVT prophylaxis: SCDs. Code Status: Full code confirmed with patient's daughter. Family Communication: Patient's daughter. Disposition Plan: Home. Consults called: Gastroenterologist Dr. Therisa Doyne. Admission status: Inpatient.   Rise Patience MD [facility] Pager [phone].  If 7PM-7AM, please contact night-coverage [email or web address] Password [facility]  05/26/2019, 10:42 PM

## 2019-05-26 NOTE — ED Triage Notes (Signed)
Pt st's she was released from hosp last week and started having black stools after getting home.  Pt st's she has had black stools everyday until today.  Denies any black stools today.  Pt was seen at [facility] Today and had blood drawn.  Pt was sent to ED ref Hgb of 7.1

## 2019-05-26 NOTE — ED Provider Notes (Signed)
[facility] EMERGENCY DEPARTMENT Provider Note   CSN: [id number] Arrival date & time: 05/26/19  1556     History   Chief Complaint Chief Complaint  Patient presents with   Abnormal Lab    HPI Abigail Shannon is a 83 y.o. female with history of hypertension who presents with a one-week history of black stools.  She has had some associated epigastric pain.  Patient has no history of this.  She has not been taking any iron or Pepto-Bismol.  Patient was seen 1 week ago for epigastric pain and a headache and had negative right upper quadrant ultrasound and CT abdomen pelvis.  Patient denies any fever, chest pain, shortness of breath, vomiting, urinary symptoms.  Patient takes Naprosyn about once a week for her foot pain, however patient's family member states she does take it more than that.  Patient has had some associated lightheadedness, especially when standing.     HPI  Past Medical History:  Diagnosis Date   Hypertension     Patient Active Problem List   Diagnosis Date Noted   Acute GI bleeding 05/26/2019   Hypertensive urgency 04/12/2018    Past Surgical History:  Procedure Laterality Date   BREAST EXCISIONAL BIOPSY Left    2 benign breast biopsies years ago   BREAST SURGERY     KNEE SURGERY       OB History   No obstetric history on file.      Home Medications    Prior to Admission medications   Medication Sig Start Date End Date Taking? Authorizing Provider  amLODipine (NORVASC) 5 MG tablet Take 5 mg by mouth daily.  03/02/19  Yes [provider]  atorvastatin (LIPITOR) 40 MG tablet Take 40 mg by mouth daily.   Yes [provider]  Cholecalciferol (VITAMIN D3) 50 MCG (2000 UT) TABS Take 2,000 Units by mouth daily with breakfast.   Yes [provider]  diphenhydrAMINE (BENADRYL) 25 MG tablet Take 1 tablet (25 mg total) by mouth every 6 (six) hours. Patient taking differently: Take 25 mg by mouth every 6 (six)  hours as needed for itching or allergies (or allergic reactions).  04/12/18  Yes Varney Biles, MD  hydrochlorothiazide (HYDRODIURIL) 25 MG tablet Take 1 tablet (25 mg total) by mouth daily. 04/12/18  Yes Nanavati, Ankit, MD  losartan (COZAAR) 100 MG tablet Take 100 mg by mouth daily.   Yes [provider]  metoCLOPramide (REGLAN) 10 MG tablet Take 1 tablet (10 mg total) by mouth every 6 (six) hours as needed for nausea (nausea/headache). 05/19/19  Yes Drenda Freeze, MD  naproxen sodium (ANAPROX) 220 MG tablet Take 220 mg by mouth every 8 (eight) hours as needed (knee pain).   Yes [provider]  amLODipine (NORVASC) 10 MG tablet Take 1 tablet (10 mg total) by mouth daily. Patient not taking: Reported on 05/26/2019 03/31/17   Gareth Morgan, MD  cephALEXin (KEFLEX) 500 MG capsule Take 1 capsule (500 mg total) by mouth 4 (four) times daily. Patient not taking: Reported on 05/26/2019 03/28/17   Virgel Manifold, MD  HYDROcodone-acetaminophen (NORCO/VICODIN) 5-325 MG tablet Take 1 tablet by mouth every 4 (four) hours as needed. Patient not taking: Reported on 05/26/2019 03/28/17   Virgel Manifold, MD  predniSONE (DELTASONE) 10 MG tablet Take 5 tablets (50 mg total) by mouth daily. Patient not taking: Reported on 05/26/2019 04/12/18   Varney Biles, MD    Family History No family history on file.  Social  History Social History   Tobacco Use   Smoking status: Never Smoker   Smokeless tobacco: Never Used  Substance Use Topics   Alcohol use: No   Drug use: No     Allergies   Aspirin and Bee venom   Review of Systems Review of Systems  Constitutional: Negative for chills and fever.  HENT: Negative for facial swelling and sore throat.   Respiratory: Negative for shortness of breath.   Cardiovascular: Negative for chest pain.  Gastrointestinal: Positive for abdominal pain, blood in stool and nausea. Negative for vomiting.  Genitourinary: Negative for dysuria.   Musculoskeletal: Negative for back pain.  Skin: Negative for rash and wound.  Neurological: Positive for light-headedness. Negative for headaches.  Psychiatric/Behavioral: The patient is not nervous/anxious.      Physical Exam Updated Vital Signs BP (!) 77/46    Pulse 70    Temp 98.5 F (36.9 C) (Oral)    Resp 18    Ht 5\' 4"  (1.626 m)    Wt 65.8 kg    SpO2 100%    BMI 24.89 kg/m   Physical Exam Vitals signs and nursing note reviewed.  Constitutional:      General: She is not in acute distress.    Appearance: She is well-developed. She is not diaphoretic.  HENT:     Head: Normocephalic and atraumatic.     Mouth/Throat:     Pharynx: No oropharyngeal exudate.  Eyes:     General: No scleral icterus.       Right eye: No discharge.        Left eye: No discharge.     Conjunctiva/sclera: Conjunctivae normal.     Pupils: Pupils are equal, round, and reactive to light.  Neck:     Musculoskeletal: Normal range of motion and neck supple.     Thyroid: No thyromegaly.  Cardiovascular:     Rate and Rhythm: Normal rate and regular rhythm.     Heart sounds: Normal heart sounds. No murmur. No friction rub. No gallop.   Pulmonary:     Effort: Pulmonary effort is normal. No respiratory distress.     Breath sounds: Normal breath sounds. No stridor. No wheezing or rales.  Abdominal:     General: Bowel sounds are normal. There is no distension.     Palpations: Abdomen is soft.     Tenderness: There is no abdominal tenderness. There is no guarding or rebound.  Genitourinary:    Rectum: Guaiac result positive.     Comments: Dark stool on exam Lymphadenopathy:     Cervical: No cervical adenopathy.  Skin:    General: Skin is warm and dry.     Coloration: Skin is not pale.     Findings: No rash.  Neurological:     Mental Status: She is alert.     Coordination: Coordination normal.      ED Treatments / Results  Labs (all labs ordered are listed, but only abnormal results are  displayed) Labs Reviewed  CBC WITH DIFFERENTIAL/PLATELET - Abnormal; Notable for the following components:      Result Value   RBC 2.29 (*)    Hemoglobin 7.1 (*)    HCT 21.4 (*)    RDW 15.9 (*)    All other components within normal limits  COMPREHENSIVE METABOLIC PANEL - Abnormal; Notable for the following components:   Potassium 3.2 (*)    Glucose, Bld 126 (*)    BUN 41 (*)    Creatinine, Ser 2.39 (*)  Calcium 8.8 (*)    Total Protein 6.3 (*)    Albumin 3.2 (*)    AST 14 (*)    GFR calc non Af Amer 18 (*)    GFR calc Af Amer 21 (*)    All other components within normal limits  PROTIME-INR - Abnormal; Notable for the following components:   Prothrombin Time 15.7 (*)    INR 1.3 (*)    All other components within normal limits  POC OCCULT BLOOD, ED - Abnormal; Notable for the following components:   Fecal Occult Bld POSITIVE (*)    All other components within normal limits  SARS CORONAVIRUS 2 (HOSPITAL ORDER, [facility])  TYPE AND SCREEN  ABO/RH  PREPARE RBC (CROSSMATCH)    EKG None  Radiology No results found.  Procedures .Critical Care Performed by: Frederica Kuster, PA-C Authorized by: Frederica Kuster, PA-C   Critical care provider statement:    Critical care time (minutes):  45   Critical care was necessary to treat or prevent imminent or life-threatening deterioration of the following conditions:  Shock, circulatory failure and renal failure (GI bleed)   Critical care was time spent personally by me on the following activities:  Discussions with consultants, evaluation of patient's response to treatment, examination of patient, ordering and performing treatments and interventions, ordering and review of laboratory studies, ordering and review of radiographic studies, pulse oximetry, re-evaluation of patient's condition, obtaining history from patient or surrogate and review of old charts   (including critical care time)  Medications  Ordered in ED Medications  pantoprazole (PROTONIX) 80 mg in sodium chloride 0.9 % 250 mL (0.32 mg/mL) infusion (8 mg/hr Intravenous New Bag/Given 05/26/19 1931)  0.9 %  sodium chloride infusion (has no administration in time range)  pantoprazole (PROTONIX) 80 mg in sodium chloride 0.9 % 100 mL IVPB (0 mg Intravenous Stopped 05/26/19 2003)  ondansetron (ZOFRAN) injection 4 mg (4 mg Intravenous Given 05/26/19 1916)  sodium chloride 0.9 % bolus 1,000 mL (1,000 mLs Intravenous New Bag/Given 05/26/19 2115)     Initial Impression / Assessment and Plan / ED Course  I have reviewed the triage vital signs and the nursing notes.  Pertinent labs & imaging results that were available during my care of the patient were reviewed by me and considered in my medical decision making (see chart for details).        Patient with dark stool, almost melena.  Patient with hemoglobin drop from 9.9-7.1 today.  Also elevated BUN and creatinine at 41 and creatinine 2.39.  I discussed patient case with gastroenterologist on-call, Dr. Therisa Doyne, who recommended Protonix twice daily dosing, if patient is not actively bleeding, and n.p.o. status for EGD tomorrow.  Single unit of blood initiated, per Dr. Therisa Doyne.  Pressure stable on admission, however some softer pressures.  Fluids initiated.  I discussed patient case with Dr. Hal Hope with [facility] who accepts patient for admission.  I appreciate the above consultants for their assistance with the patient.  Patient and family understand agree with plan.  Final Clinical Impressions(s) / ED Diagnoses   Final diagnoses:  Symptomatic anemia  Gastrointestinal hemorrhage with melena  AKI (acute kidney injury) [facility])    ED Discharge Orders    None       Frederica Kuster, PA-C 05/26/19 2133    Maudie Flakes, MD 05/28/19 [phone]

## 2019-05-26 NOTE — ED Notes (Signed)
Blood consent signed and at bedside  

## 2019-05-26 NOTE — ED Notes (Signed)
Pt's p trending down. Dr. Hal Hope page. Verbal orders given to lay pt flatter and given another bolus.

## 2019-05-27 ENCOUNTER — Inpatient Hospital Stay (HOSPITAL_COMMUNITY): Payer: Medicare Other

## 2019-05-27 ENCOUNTER — Encounter (HOSPITAL_COMMUNITY): Payer: Self-pay | Admitting: *Deleted

## 2019-05-27 ENCOUNTER — Encounter (HOSPITAL_COMMUNITY)
Admission: EM | Disposition: A | Discharge status: Home Health | Payer: Self-pay | Source: Home / Self Care | Attending: Internal Medicine

## 2019-05-27 ENCOUNTER — Inpatient Hospital Stay (HOSPITAL_COMMUNITY): Payer: Medicare Other | Admitting: Certified Registered Nurse Anesthetist

## 2019-05-27 HISTORY — PX: BIOPSY: SHX5522

## 2019-05-27 HISTORY — PX: HEMOSTASIS CONTROL: SHX6838

## 2019-05-27 HISTORY — PX: ESOPHAGOGASTRODUODENOSCOPY (EGD) WITH PROPOFOL: SHX5813

## 2019-05-27 LAB — BASIC METABOLIC PANEL
Anion gap: 10 (ref 5–15)
BUN: 32 mg/dL — ABNORMAL HIGH (ref 8–23)
CO2: 19 mmol/L — ABNORMAL LOW (ref 22–32)
Calcium: 7.7 mg/dL — ABNORMAL LOW (ref 8.9–10.3)
Chloride: 117 mmol/L — ABNORMAL HIGH (ref 98–111)
Creatinine, Ser: 1.7 mg/dL — ABNORMAL HIGH (ref 0.44–1.00)
GFR calc Af Amer: 32 mL/min — ABNORMAL LOW (ref >60)
GFR calc non Af Amer: 28 mL/min — ABNORMAL LOW (ref >60)
Glucose, Bld: 94 mg/dL (ref 70–99)
Potassium: 3.3 mmol/L — ABNORMAL LOW (ref 3.5–5.1)
Sodium: 146 mmol/L — ABNORMAL HIGH (ref 135–145)

## 2019-05-27 LAB — TYPE AND SCREEN
ABO/RH(D): O POS
Antibody Screen: NEGATIVE
Unit division: 0
Unit division: 0

## 2019-05-27 LAB — CBC
HCT: 27.1 % — ABNORMAL LOW (ref 36.0–46.0)
HCT: 27.2 % — ABNORMAL LOW (ref 36.0–46.0)
HCT: 34 % — ABNORMAL LOW (ref 36.0–46.0)
Hemoglobin: 9.2 g/dL — ABNORMAL LOW (ref 12.0–15.0)
Hemoglobin: 9.1 g/dL — ABNORMAL LOW (ref 12.0–15.0)
Hemoglobin: 10.8 g/dL — ABNORMAL LOW (ref 12.0–15.0)
MCH: 30.9 pg (ref 26.0–34.0)
MCH: 30.5 pg (ref 26.0–34.0)
MCH: 29.9 pg (ref 26.0–34.0)
MCHC: 33.9 g/dL (ref 30.0–36.0)
MCHC: 33.5 g/dL (ref 30.0–36.0)
MCHC: 31.8 g/dL (ref 30.0–36.0)
MCV: 90.9 fL (ref 80.0–100.0)
MCV: 91.3 fL (ref 80.0–100.0)
MCV: 94.2 fL (ref 80.0–100.0)
Platelets: 144 10*3/uL — ABNORMAL LOW (ref 150–400)
Platelets: 152 10*3/uL (ref 150–400)
Platelets: 170 10*3/uL (ref 150–400)
RBC: 2.98 MIL/uL — ABNORMAL LOW (ref 3.87–5.11)
RBC: 2.98 MIL/uL — ABNORMAL LOW (ref 3.87–5.11)
RBC: 3.61 MIL/uL — ABNORMAL LOW (ref 3.87–5.11)
RDW: 15.8 % — ABNORMAL HIGH (ref 11.5–15.5)
RDW: 15.8 % — ABNORMAL HIGH (ref 11.5–15.5)
RDW: 16 % — ABNORMAL HIGH (ref 11.5–15.5)
WBC: 8.8 10*3/uL (ref 4.0–10.5)
WBC: 7.9 10*3/uL (ref 4.0–10.5)
WBC: 9 10*3/uL (ref 4.0–10.5)
nRBC: 0 % (ref 0.0–0.2)
nRBC: 0.3 % — ABNORMAL HIGH (ref 0.0–0.2)
nRBC: 0 % (ref 0.0–0.2)

## 2019-05-27 LAB — GLUCOSE, CAPILLARY
Glucose-Capillary: 70 mg/dL (ref 70–99)
Glucose-Capillary: 84 mg/dL (ref 70–99)

## 2019-05-27 LAB — BPAM RBC
Blood Product Expiration Date: 202010222359
Blood Product Expiration Date: 202010232359
ISSUE DATE / TIME: 202009172010
ISSUE DATE / TIME: 202009172343
Unit Type and Rh: 5100
Unit Type and Rh: 5100

## 2019-05-27 LAB — URINALYSIS, ROUTINE W REFLEX MICROSCOPIC
Bilirubin Urine: NEGATIVE
Glucose, UA: NEGATIVE mg/dL
Hgb urine dipstick: NEGATIVE
Ketones, ur: NEGATIVE mg/dL
Leukocytes,Ua: NEGATIVE
Nitrite: NEGATIVE
Protein, ur: NEGATIVE mg/dL
Specific Gravity, Urine: 1.01 (ref 1.005–1.030)
pH: 5 (ref 5.0–8.0)

## 2019-05-27 SURGERY — ESOPHAGOGASTRODUODENOSCOPY (EGD) WITH PROPOFOL
Anesthesia: Monitor Anesthesia Care

## 2019-05-27 MED ORDER — LIDOCAINE HCL (CARDIAC) PF 100 MG/5ML IV SOSY
PREFILLED_SYRINGE | INTRAVENOUS | Status: DC | PRN
  Administered 2019-05-27: 60 mg via INTRATRACHEAL

## 2019-05-27 MED ORDER — PROPOFOL 500 MG/50ML IV EMUL
INTRAVENOUS | Status: DC | PRN
  Administered 2019-05-27: 50 ug/kg/min via INTRAVENOUS

## 2019-05-27 MED ORDER — SODIUM CHLORIDE 0.9 % IV SOLN: INTRAVENOUS | Status: DC

## 2019-05-27 MED ORDER — POTASSIUM CHLORIDE CRYS ER 20 MEQ PO TBCR
40.0000 meq | EXTENDED_RELEASE_TABLET | Freq: Once | ORAL | Status: AC
  Administered 2019-05-27: 40 meq via ORAL
  Filled 2019-05-27: qty 2

## 2019-05-27 MED ORDER — PROPOFOL 10 MG/ML IV BOLUS
INTRAVENOUS | Status: DC | PRN
  Administered 2019-05-27: 30 mg via INTRAVENOUS
  Administered 2019-05-27 (×2): 20 mg via INTRAVENOUS
  Administered 2019-05-27: 30 mg via INTRAVENOUS

## 2019-05-27 MED ORDER — ONDANSETRON HCL 4 MG/2ML IJ SOLN
INTRAMUSCULAR | Status: DC | PRN
  Administered 2019-05-27: 4 mg via INTRAVENOUS

## 2019-05-27 SURGICAL SUPPLY — 14 items

## 2019-05-27 NOTE — Brief Op Note (Signed)
05/26/2019 - 05/27/2019  9:22 AM  PATIENT:  Norman Clay  83 y.o. female  PRE-OPERATIVE DIAGNOSIS:  GI bleed  POST-OPERATIVE DIAGNOSIS:  hiatal hernia, gold prob for large cratered gastric ulcer, 6 smaller ulcers, biopsies for H Pylori   PROCEDURE:  Procedure(s): ESOPHAGOGASTRODUODENOSCOPY (EGD) WITH PROPOFOL (N/A) BIOPSY HEMOSTASIS CONTROL  SURGEON:  Surgeon(s) and Role:    Ronnette Juniper, MD - Primary  PHYSICIAN ASSISTANT:   ASSISTANTS:Jamie Mel Almond, RN, William Dalton, Tech  ANESTHESIA:   MAC  EBL:  Minimal  BLOOD ADMINISTERED:none  DRAINS: none   LOCAL MEDICATIONS USED:  NONE  SPECIMEN:  Biopsy / Limited Resection  DISPOSITION OF SPECIMEN:  PATHOLOGY  COUNTS:  YES  TOURNIQUET:  * No tourniquets in log *  DICTATION: .Dragon Dictation  PLAN OF CARE: Admit to inpatient   PATIENT DISPOSITION:  PACU - hemodynamically stable.   Delay start of Pharmacological VTE agent (>24hrs) due to surgical blood loss or risk of bleeding: yes

## 2019-05-27 NOTE — Transfer of Care (Signed)
Immediate Anesthesia Transfer of Care Note  Patient: Abigail Shannon  Procedure(s) Performed: ESOPHAGOGASTRODUODENOSCOPY (EGD) WITH PROPOFOL (N/A ) BIOPSY HEMOSTASIS CONTROL  Patient Location: Endoscopy Unit  Anesthesia Type:MAC  Level of Consciousness: drowsy and patient cooperative  Airway & Oxygen Therapy: Patient Spontanous Breathing and Patient connected to nasal cannula oxygen  Post-op Assessment: Report given to RN and Post -op Vital signs reviewed and stable  Post vital signs: Reviewed and stable  Last Vitals:  Vitals Value Taken Time  BP 125/46 05/27/19 0923  Temp    Pulse    Resp 23 05/27/19 0924  SpO2    Vitals shown include unvalidated device data.  Last Pain:  Vitals:   05/27/19 0752  TempSrc: Oral  PainSc: 0-No pain         Complications: No apparent anesthesia complications

## 2019-05-27 NOTE — Progress Notes (Signed)
Pt cbg was 70 alert and oriented, given orange juice repeat cbg 86 and eating her dinner, family at the bedside, no complain of pain at this time.

## 2019-05-27 NOTE — ED Notes (Signed)
Full liquid lunch tray ordered

## 2019-05-27 NOTE — Op Note (Addendum)
EGD was performed for acute blood loss anemia and melena..  Findings: Normal-appearing esophagus, widely patent Schatzki's ring at GE junction, 4 cm hiatal hernia. Diffuse moderately erythematous mucosa with contact bleeding in gastric body, treated with bipolar cautery to stop active oozing. 1 nonbleeding cratered gastric ulcer at the pylorus, 2 cm in size with pigmented spots, treated with bipolar cautery. At least 6 nonbleeding cratered and superficial gastric ulcers about 6 mm in size noted in the gastric antrum. Erythematous antrum, biopsies taken for H. Pylori. Normal-appearing cardia and fundus on retroflexion. Normal-appearing duodenal bulb and duodenum.  Recommendations: Continue Protonix drip 8 mg/h IV for another 24 hours. Thereafter, Protonix/PPI twice daily for 8 weeks. Avoid NSAIDs and aspirin. Full liquid diet today. If hemoglobin remains stable and no further episodes of melena, resume regular diet and okay to DC in a.m.. Biopsies may be followed as an outpatient.  Results were discussed with her daughter Lynnell Grain over the phone [phone] and with the patient at bedside post procedure.  Ronnette Juniper, MD

## 2019-05-27 NOTE — Progress Notes (Signed)
Pt new admit from ED , Dx upper GI bleeding, alert and oriented, room air, no complain of abdominal pain at this time, given 2 units of PRBC in ED latest hgb 9.1, with right hand peripheral IV line, full liquid diet.

## 2019-05-27 NOTE — Anesthesia Preprocedure Evaluation (Signed)
Anesthesia Evaluation  Patient identified by MRN, date of birth, ID band Patient awake    Reviewed: Allergy & Precautions, NPO status , Patient's Chart, lab work & pertinent test results  Airway Mallampati: I  TM Distance: >3 FB Neck ROM: Full    Dental no notable dental hx. (+) Upper Dentures, Lower Dentures   Pulmonary neg pulmonary ROS,    Pulmonary exam normal breath sounds clear to auscultation       Cardiovascular Exercise Tolerance: Good hypertension, Pt. on medications Normal cardiovascular exam Rhythm:Regular Rate:Normal     Neuro/Psych negative neurological ROS  negative psych ROS   GI/Hepatic negative GI ROS, Neg liver ROS,   Endo/Other  negative endocrine ROS  Renal/GU Renal disease     Musculoskeletal negative musculoskeletal ROS (+)   Abdominal   Peds  Hematology  (+) anemia , Hgb 9.2   Anesthesia Other Findings   Reproductive/Obstetrics                            Anesthesia Physical Anesthesia Plan  ASA: III  Anesthesia Plan: MAC   Post-op Pain Management:    Induction: Intravenous  PONV Risk Score and Plan: 3 and Treatment may vary due to age or medical condition, Ondansetron, Dexamethasone and Midazolam  Airway Management Planned: Natural Airway and Nasal Cannula  Additional Equipment: None  Intra-op Plan:   Post-operative Plan:   Informed Consent: I have reviewed the patients History and Physical, chart, labs and discussed the procedure including the risks, benefits and alternatives for the proposed anesthesia with the patient or authorized representative who has indicated his/her understanding and acceptance.     Dental advisory given  Plan Discussed with:   Anesthesia Plan Comments:         Anesthesia Quick Evaluation

## 2019-05-27 NOTE — Anesthesia Postprocedure Evaluation (Signed)
Anesthesia Post Note  Patient: Abigail Shannon  Procedure(s) Performed: ESOPHAGOGASTRODUODENOSCOPY (EGD) WITH PROPOFOL (N/A ) BIOPSY HEMOSTASIS CONTROL     Patient location during evaluation: Endoscopy Anesthesia Type: MAC Level of consciousness: awake and alert Pain management: pain level controlled Vital Signs Assessment: post-procedure vital signs reviewed and stable Respiratory status: spontaneous breathing, nonlabored ventilation, respiratory function stable and patient connected to nasal cannula oxygen Cardiovascular status: blood pressure returned to baseline and stable Postop Assessment: no apparent nausea or vomiting Anesthetic complications: no    Last Vitals:  Vitals:   05/27/19 0935 05/27/19 1315  BP:    Pulse: 76 72  Resp:  (!) 23  Temp:    SpO2: 94% 97%    Last Pain:  Vitals:   05/27/19 0935  TempSrc:   PainSc: 0-No pain                 Barnet Glasgow

## 2019-05-27 NOTE — Progress Notes (Addendum)
PROGRESS NOTE    Abigail Shannon  [id number] DOB: 20-Sep-1935 DOA: 05/26/2019 PCP: Haywood Pao, MD  Brief Narrative:Abigail Shannon is a 83 y.o. female with history of hypertension hyperlipidemia arthritis on Aleve had come to the ER on September 10 with complaints of abdominal pain dizziness and headache at that time CT abdomen and CT head and ultrasound were done which largely were unremarkable and patient was discharged home.   Patient also has been noticing black stools over the last 1 week.  Patient was taken to her primary care physician by her daughter and over the had blood work done which showed low hemoglobin and was advised to come to the ER.  In the ER on arrival patient's blood pressure was in the low 90s and at times had dropped below 90s.  Was given fluid bolus following which blood pressure improved.  Stool for occult blood was positive hemoglobin was 7.1   Assessment & Plan:    1.  Upper GI bleed/melena -Likely NSAID induced, PUD -Continue Protonix infusion -[facility] consulting, endoscopy today -Monitor hemoglobin every 12 -Stop IV fluids  2.  Acute blood loss anemia -Hemoglobin down to 7.1 from 12/13 last year -Due to above, status post 2 units of PRBC overnight -Monitor hemoglobin every 12 -Check anemia panel  3.  Essential hypertension -BP in the low normal range, antihypertensives on hold acutely in the setting of above  4.  Acute kidney injury -Due to NSAIDs, blood loss and hypotension -Improving -Monitor, bmet in a.m.   DVT prophylaxis: CDs Code Status: Full code Family Communication: No family at bedside Disposition Plan: Home tomorrow if stable pending above work-up  Consultants:   [facility]   Procedures:   Antimicrobials:    Subjective: -about to go down for endoscopy, no further bleeding this morning -Feels better after blood transfusion  Objective: Vitals:   05/27/19 0600 05/27/19 0752 05/27/19 0929  05/27/19 0935  BP: 107/65 (!) 128/54 (!) 125/46   Pulse:  71 75 76  Resp: 19 (!) 23 (!) 21   Temp:  97.9 F (36.6 C) (!) 97 F (36.1 C)   TempSrc:  Oral Oral   SpO2:  99% 96% 94%  Weight:      Height:        Intake/Output Summary (Last 24 hours) at 05/27/2019 1158 Last data filed at 05/27/2019 1109 Gross per 24 hour  Intake 1037.67 ml  Output 5 ml  Net 1032.67 ml   Filed Weights   05/26/19 1610  Weight: 65.8 kg    Examination:  General exam: Appears calm and comfortable,, AAO x3 Respiratory system: Clear to auscultation.  Cardiovascular system: S1 & S2 heard, RRR. Gastrointestinal system: Abdomen is nondistended, soft and nontender.Normal bowel sounds heard. Central nervous system: Alert and oriented. No focal neurological deficits. Extremities: No edema Skin: No rashes, lesions or ulcers Psychiatry: Judgement and insight appear normal. Mood & affect appropriate.     Data Reviewed:   CBC: Recent Labs  Lab 05/26/19 1616 05/27/19 0432 05/27/19 1003  WBC 9.3 8.8 7.9  NEUTROABS 7.0  --   --   HGB 7.1* 9.2* 9.1*  HCT 21.4* 27.1* 27.2*  MCV 93.4 90.9 91.3  PLT 212 144* 0000000   Basic Metabolic Panel: Recent Labs  Lab 05/26/19 1616 05/27/19 0432  NA 143 146*  K 3.2* 3.3*  CL 108 117*  CO2 22 19*  GLUCOSE 126* 94  BUN 41* 32*  CREATININE 2.39* 1.70*  CALCIUM 8.8* 7.7*  GFR: Estimated Creatinine Clearance: 24.2 mL/min (A) (by C-G formula based on SCr of 1.7 mg/dL (H)). Liver Function Tests: Recent Labs  Lab 05/26/19 1616  AST 14*  ALT 9  ALKPHOS 103  BILITOT 0.8  PROT 6.3*  ALBUMIN 3.2*   No results for input(s): LIPASE, AMYLASE in the last 168 hours. No results for input(s): AMMONIA in the last 168 hours. Coagulation Profile: Recent Labs  Lab 05/26/19 1903  INR 1.3*   Cardiac Enzymes: No results for input(s): CKTOTAL, CKMB, CKMBINDEX, TROPONINI in the last 168 hours. BNP (last 3 results) No results for input(s): PROBNP in the last 8760  hours. HbA1C: No results for input(s): HGBA1C in the last 72 hours. CBG: No results for input(s): GLUCAP in the last 168 hours. Lipid Profile: No results for input(s): CHOL, HDL, LDLCALC, TRIG, CHOLHDL, LDLDIRECT in the last 72 hours. Thyroid Function Tests: No results for input(s): TSH, T4TOTAL, FREET4, T3FREE, THYROIDAB in the last 72 hours. Anemia Panel: No results for input(s): VITAMINB12, FOLATE, FERRITIN, TIBC, IRON, RETICCTPCT in the last 72 hours. Urine analysis:    Component Value Date/Time   COLORURINE YELLOW 05/19/2019 2204   APPEARANCEUR HAZY (A) 05/19/2019 2204   LABSPEC 1.020 05/19/2019 2204   PHURINE 5.0 05/19/2019 [place] 05/19/2019 2204   HGBUR NEGATIVE 05/19/2019 2204   BILIRUBINUR NEGATIVE 05/19/2019 2204   KETONESUR NEGATIVE 05/19/2019 2204   PROTEINUR NEGATIVE 05/19/2019 2204   UROBILINOGEN 2.0 (H) 12/26/2010 1007   NITRITE NEGATIVE 05/19/2019 2204   LEUKOCYTESUR NEGATIVE 05/19/2019 2204   Sepsis Labs: @LABRCNTIP (procalcitonin:4,lacticidven:4)  ) Recent Results (from the past 240 hour(s))  SARS Coronavirus 2 [facility] order, Performed in [facility] hospital lab) Nasopharyngeal Nasopharyngeal Swab     Status: None   Collection Time: 05/26/19  8:05 PM   Specimen: Nasopharyngeal Swab  Result Value Ref Range Status   SARS Coronavirus 2 NEGATIVE NEGATIVE Final    Comment: (NOTE) If result is NEGATIVE SARS-CoV-2 target nucleic acids are NOT DETECTED. The SARS-CoV-2 RNA is generally detectable in upper and lower  respiratory specimens during the acute phase of infection. The lowest  concentration of SARS-CoV-2 viral copies this assay can detect is 250  copies / mL. A negative result does not preclude SARS-CoV-2 infection  and should not be used as the sole basis for treatment or other  patient management decisions.  A negative result may occur with  improper specimen collection / handling, submission of specimen other  than nasopharyngeal  swab, presence of viral mutation(s) within the  areas targeted by this assay, and inadequate number of viral copies  (<250 copies / mL). A negative result must be combined with clinical  observations, patient history, and epidemiological information. If result is POSITIVE SARS-CoV-2 target nucleic acids are DETECTED. The SARS-CoV-2 RNA is generally detectable in upper and lower  respiratory specimens dur ing the acute phase of infection.  Positive  results are indicative of active infection with SARS-CoV-2.  Clinical  correlation with patient history and other diagnostic information is  necessary to determine patient infection status.  Positive results do  not rule out bacterial infection or co-infection with other viruses. If result is PRESUMPTIVE POSTIVE SARS-CoV-2 nucleic acids MAY BE PRESENT.   A presumptive positive result was obtained on the submitted specimen  and confirmed on repeat testing.  While 2019 novel coronavirus  (SARS-CoV-2) nucleic acids may be present in the submitted sample  additional confirmatory testing may be necessary for epidemiological  and / or clinical  management purposes  to differentiate between  SARS-CoV-2 and other Sarbecovirus currently known to infect humans.  If clinically indicated additional testing with an alternate test  methodology [phone]) is advised. The SARS-CoV-2 RNA is generally  detectable in upper and lower respiratory sp ecimens during the acute  phase of infection. The expected result is Negative. Fact Sheet for Patients:  StrictlyIdeas.no Fact Sheet for Healthcare Providers: BankingDealers.co.za This test is not yet approved or cleared by the [place] FDA and has been authorized for detection and/or diagnosis of SARS-CoV-2 by FDA under an Emergency Use Authorization (EUA).  This EUA will remain in effect (meaning this test can be used) for the duration of the COVID-19 declaration  under Section 564(b)(1) of the Act, 21 U.S.C. section 360bbb-3(b)(1), unless the authorization is terminated or revoked sooner. Performed at [facility], [place] [address], [place], [place] [address]          Radiology Studies: Dg Abd Acute 2+v W 1v Chest  Result Date: 05/27/2019 CLINICAL DATA:  Blacked are stools EXAM: DG ABDOMEN ACUTE W/ 1V CHEST COMPARISON:  05/19/2019 FINDINGS: Cardiac shadows within normal limits. Mild aortic calcifications are seen. The lungs are well aerated without focal infiltrate. No bony abnormality is noted. Scattered large and small bowel gas is noted. No free air is seen. No mass lesion or abnormal calcifications are noted. Degenerative changes of lumbar spine are seen. IMPRESSION: No acute abnormality noted. Electronically Signed   By: Inez Catalina M.D.   On: 05/27/2019 01:11        Scheduled Meds: Continuous Infusions: . pantoprozole (PROTONIX) infusion 8 mg/hr (05/27/19 0617)     LOS: 1 day    Time spent: 27min    Domenic Polite, MD [facility] Page via [email or web address], password [email or web address] After 7PM please contact night-coverage  05/27/2019, 11:58 AM

## 2019-05-27 NOTE — ED Notes (Signed)
ED TO INPATIENT HANDOFF REPORT  ED Nurse Name and Phone #: [phone]  S Name/Age/Gender Abigail Shannon 83 y.o. female Room/Bed: 044C/044C  Code Status   Code Status: Full Code  Home/SNF/Other   Triage Complete: Triage complete  Chief Complaint AKI (acute kidney injury) ([place]) [N17.9] Abdominal pain [R10.9] Gastrointestinal hemorrhage with melena [K92.1] Symptomatic anemia [D64.9]  Triage Note Pt st's she was released from hosp last week and started having black stools after getting home.  Pt st's she has had black stools everyday until today.  Denies any black stools today.  Pt was seen at [facility]. Today and had blood drawn.  Pt was sent to ED ref Hgb of 7.1   Allergies Allergies  Allergen Reactions  . Aspirin Anaphylaxis and Swelling  . Bee Venom Anaphylaxis and Swelling    Level of Care/Admitting Diagnosis ED Disposition    ED Disposition Condition [facility] Area: [place] [[id number]]  Level of Care: Progressive [102]  Covid Evaluation: N/A  Diagnosis: Acute GI bleeding [id number]  Admitting Physician: Rise Patience [phone]  Attending Physician: Rise Patience [phone]  Estimated length of stay: past midnight tomorrow  Certification:: I certify this patient will need inpatient services for at least 2 midnights  PT Class (Do Not Modify): Inpatient [101]  PT Acc Code (Do Not Modify): Private [1]       B Medical/Surgery History Past Medical History:  Diagnosis Date  . Hypertension    Past Surgical History:  Procedure Laterality Date  . BREAST EXCISIONAL BIOPSY Left    2 benign breast biopsies years ago  . BREAST SURGERY    . KNEE SURGERY       A IV Location/Drains/Wounds Patient Lines/Drains/Airways Status   Active Line/Drains/Airways    Name:   Placement date:   Placement time:   Site:   Days:   Peripheral IV 05/26/19 Right Antecubital   05/26/19    1901    Antecubital   1   Peripheral IV  05/26/19 Right Hand   05/26/19    2023    Hand   1          Intake/Output Last 24 hours  Intake/Output Summary (Last 24 hours) at 05/27/2019 1324 Last data filed at 05/27/2019 1109 Gross per 24 hour  Intake 1037.67 ml  Output 5 ml  Net 1032.67 ml    Labs/Imaging Results for orders placed or performed during the hospital encounter of 05/26/19 (from the past 48 hour(s))  Type and screen [facility]     Status: None   Collection Time: 05/26/19  4:08 PM  Result Value Ref Range   ABO/RH(D) O POS    Antibody Screen NEG    Sample Expiration 05/29/2019,2359    Unit Number [id number]    Blood Component Type RED CELLS,LR    Unit division 00    Status of Unit ISSUED,FINAL    Transfusion Status OK TO TRANSFUSE    Crossmatch Result      Compatible Performed at [facility], [address] [place], [place] [address]    Unit Number [id number]    Blood Component Type RED CELLS,LR    Unit division 00    Status of Unit ISSUED,FINAL    Transfusion Status OK TO TRANSFUSE    Crossmatch Result Compatible   ABO/Rh     Status: None   Collection Time: 05/26/19  4:08 PM  Result Value Ref Range  ABO/RH(D)      O POS Performed at [facility], [place] [address] [place], [place] [address]   CBC with Differential     Status: Abnormal   Collection Time: 05/26/19  4:16 PM  Result Value Ref Range   WBC 9.3 4.0 - 10.5 K/uL   RBC 2.29 (L) 3.87 - 5.11 MIL/uL   Hemoglobin 7.1 (L) 12.0 - 15.0 g/dL   HCT 21.4 (L) 36.0 - 46.0 %   MCV 93.4 80.0 - 100.0 fL   MCH 31.0 26.0 - 34.0 pg   MCHC 33.2 30.0 - 36.0 g/dL   RDW 15.9 (H) 11.5 - 15.5 %   Platelets 212 150 - 400 K/uL   nRBC 0.2 0.0 - 0.2 %   Neutrophils Relative % 74 %   Neutro Abs 7.0 1.7 - 7.7 K/uL   Lymphocytes Relative 17 %   Lymphs Abs 1.6 0.7 - 4.0 K/uL   Monocytes Relative 7 %   Monocytes Absolute 0.6 0.1 - 1.0 K/uL   Eosinophils Relative 1 %   Eosinophils Absolute 0.1 0.0 - 0.5 K/uL   Basophils  Relative 1 %   Basophils Absolute 0.1 0.0 - 0.1 K/uL   Immature Granulocytes 0 %   Abs Immature Granulocytes 0.03 0.00 - 0.07 K/uL    Comment: Performed at [facility] [address], [place], [place] [address]  Comprehensive metabolic panel     Status: Abnormal   Collection Time: 05/26/19  4:16 PM  Result Value Ref Range   Sodium 143 135 - 145 mmol/L   Potassium 3.2 (L) 3.5 - 5.1 mmol/L   Chloride 108 98 - 111 mmol/L   CO2 22 22 - 32 mmol/L   Glucose, Bld 126 (H) 70 - 99 mg/dL   BUN 41 (H) 8 - 23 mg/dL   Creatinine, Ser 2.39 (H) 0.44 - 1.00 mg/dL   Calcium 8.8 (L) 8.9 - 10.3 mg/dL   Total Protein 6.3 (L) 6.5 - 8.1 g/dL   Albumin 3.2 (L) 3.5 - 5.0 g/dL   AST 14 (L) 15 - 41 U/L   ALT 9 0 - 44 U/L   Alkaline Phosphatase 103 38 - 126 U/L   Total Bilirubin 0.8 0.3 - 1.2 mg/dL   GFR calc non Af Amer 18 (L) >60 mL/min   GFR calc Af Amer 21 (L) >60 mL/min   Anion gap 13 5 - 15    Comment: Performed at [facility], [place] [address], [place], [place] [address]  Protime-INR     Status: Abnormal   Collection Time: 05/26/19  7:03 PM  Result Value Ref Range   Prothrombin Time 15.7 (H) 11.4 - 15.2 seconds   INR 1.3 (H) 0.8 - 1.2    Comment: (NOTE) INR goal varies based on device and disease states. Performed at [facility], [place] [address] [place], [place] [address]   POC occult blood, ED     Status: Abnormal   Collection Time: 05/26/19  7:18 PM  Result Value Ref Range   Fecal Occult Bld POSITIVE (A) NEGATIVE  Prepare RBC     Status: None   Collection Time: 05/26/19  8:00 PM  Result Value Ref Range   Order Confirmation      ORDER PROCESSED BY BLOOD BANK Performed at [facility], [place] [address] [place],  [address]   SARS Coronavirus 2 [facility] order, Performed in [facility] hospital lab) Nasopharyngeal Nasopharyngeal Swab  Status: None   Collection Time: 05/26/19  8:05 PM   Specimen: Nasopharyngeal Swab  Result Value Ref Range   SARS  Coronavirus 2 NEGATIVE NEGATIVE    Comment: (NOTE) If result is NEGATIVE SARS-CoV-2 target nucleic acids are NOT DETECTED. The SARS-CoV-2 RNA is generally detectable in upper and lower  respiratory specimens during the acute phase of infection. The lowest  concentration of SARS-CoV-2 viral copies this assay can detect is 250  copies / mL. A negative result does not preclude SARS-CoV-2 infection  and should not be used as the sole basis for treatment or other  patient management decisions.  A negative result may occur with  improper specimen collection / handling, submission of specimen other  than nasopharyngeal swab, presence of viral mutation(s) within the  areas targeted by this assay, and inadequate number of viral copies  (<250 copies / mL). A negative result must be combined with clinical  observations, patient history, and epidemiological information. If result is POSITIVE SARS-CoV-2 target nucleic acids are DETECTED. The SARS-CoV-2 RNA is generally detectable in upper and lower  respiratory specimens dur ing the acute phase of infection.  Positive  results are indicative of active infection with SARS-CoV-2.  Clinical  correlation with patient history and other diagnostic information is  necessary to determine patient infection status.  Positive results do  not rule out bacterial infection or co-infection with other viruses. If result is PRESUMPTIVE POSTIVE SARS-CoV-2 nucleic acids MAY BE PRESENT.   A presumptive positive result was obtained on the submitted specimen  and confirmed on repeat testing.  While 2019 novel coronavirus  (SARS-CoV-2) nucleic acids may be present in the submitted sample  additional confirmatory testing may be necessary for epidemiological  and / or clinical management purposes  to differentiate between  SARS-CoV-2 and other Sarbecovirus currently known to infect humans.  If clinically indicated additional testing with an alternate test  methodology  [phone]) is advised. The SARS-CoV-2 RNA is generally  detectable in upper and lower respiratory sp ecimens during the acute  phase of infection. The expected result is Negative. Fact Sheet for Patients:  StrictlyIdeas.no Fact Sheet for Healthcare Providers: BankingDealers.co.za This test is not yet approved or cleared by the [place] FDA and has been authorized for detection and/or diagnosis of SARS-CoV-2 by FDA under an Emergency Use Authorization (EUA).  This EUA will remain in effect (meaning this test can be used) for the duration of the COVID-19 declaration under Section 564(b)(1) of the Act, 21 U.S.C. section 360bbb-3(b)(1), unless the authorization is terminated or revoked sooner. Performed at [facility], [place] [address] [place], [place] [id number]   Basic metabolic panel     Status: Abnormal   Collection Time: 05/27/19  4:32 AM  Result Value Ref Range   Sodium 146 (H) 135 - 145 mmol/L   Potassium 3.3 (L) 3.5 - 5.1 mmol/L   Chloride 117 (H) 98 - 111 mmol/L   CO2 19 (L) 22 - 32 mmol/L   Glucose, Bld 94 70 - 99 mg/dL   BUN 32 (H) 8 - 23 mg/dL   Creatinine, Ser 1.70 (H) 0.44 - 1.00 mg/dL   Calcium 7.7 (L) 8.9 - 10.3 mg/dL   GFR calc non Af Amer 28 (L) >60 mL/min   GFR calc Af Amer 32 (L) >60 mL/min   Anion gap 10 5 - 15    Comment: Performed at [facility] [address], [place], [place] [address]  CBC  Status: Abnormal   Collection Time: 05/27/19  4:32 AM  Result Value Ref Range   WBC 8.8 4.0 - 10.5 K/uL   RBC 2.98 (L) 3.87 - 5.11 MIL/uL   Hemoglobin 9.2 (L) 12.0 - 15.0 g/dL    Comment: REPEATED TO VERIFY POST TRANSFUSION SPECIMEN    HCT 27.1 (L) 36.0 - 46.0 %   MCV 90.9 80.0 - 100.0 fL   MCH 30.9 26.0 - 34.0 pg   MCHC 33.9 30.0 - 36.0 g/dL   RDW 15.8 (H) 11.5 - 15.5 %   Platelets 144 (L) 150 - 400 K/uL   nRBC 0.0 0.0 - 0.2 %    Comment: Performed at [facility] [address]  [place], [place] [address]  CBC     Status: Abnormal   Collection Time: 05/27/19 10:03 AM  Result Value Ref Range   WBC 7.9 4.0 - 10.5 K/uL   RBC 2.98 (L) 3.87 - 5.11 MIL/uL   Hemoglobin 9.1 (L) 12.0 - 15.0 g/dL   HCT 27.2 (L) 36.0 - 46.0 %   MCV 91.3 80.0 - 100.0 fL   MCH 30.5 26.0 - 34.0 pg   MCHC 33.5 30.0 - 36.0 g/dL   RDW 15.8 (H) 11.5 - 15.5 %   Platelets 152 150 - 400 K/uL   nRBC 0.3 (H) 0.0 - 0.2 %    Comment: Performed at [facility], [place] [address], [place], [place] [address]   Dg Abd Acute 2+v W 1v Chest  Result Date: 05/27/2019 CLINICAL DATA:  Blacked are stools EXAM: DG ABDOMEN ACUTE W/ 1V CHEST COMPARISON:  05/19/2019 FINDINGS: Cardiac shadows within normal limits. Mild aortic calcifications are seen. The lungs are well aerated without focal infiltrate. No bony abnormality is noted. Scattered large and small bowel gas is noted. No free air is seen. No mass lesion or abnormal calcifications are noted. Degenerative changes of lumbar spine are seen. IMPRESSION: No acute abnormality noted. Electronically Signed   By: Inez Catalina M.D.   On: 05/27/2019 01:11    Pending Labs Unresulted Labs (From admission, onward)    Start     Ordered   05/28/19 [id number]  Basic metabolic panel  Tomorrow morning,   R     05/27/19 1047   05/27/19 1800  CBC  Now then every 12 hours,   R (with STAT occurrences)     05/27/19 1047   05/27/19 0627  Urinalysis, Routine w reflex microscopic  ONCE - STAT,   STAT     05/27/19 0626          Vitals/Pain Today's Vitals   05/27/19 0752 05/27/19 0929 05/27/19 0935 05/27/19 1315  BP: (!) 128/54 (!) 125/46    Pulse: 71 75 76 72  Resp: (!) 23 (!) 21  (!) 23  Temp: 97.9 F (36.6 C) (!) 97 F (36.1 C)    TempSrc: Oral Oral    SpO2: 99% 96% 94% 97%  Weight:      Height:      PainSc: 0-No pain 0-No pain 0-No pain     Isolation Precautions No active isolations  Medications Medications  pantoprazole (PROTONIX) 80 mg in sodium chloride 0.9 % 250  mL (0.32 mg/mL) infusion (8 mg/hr Intravenous New Bag/Given 05/27/19 0617)  acetaminophen (TYLENOL) tablet 650 mg ( Oral MAR Unhold 05/27/19 0938)    Or  acetaminophen (TYLENOL) suppository 650 mg ( Rectal MAR Unhold 05/27/19 0938)  ondansetron (ZOFRAN) tablet 4 mg ( Oral MAR Unhold  05/27/19 0938)    Or  ondansetron (ZOFRAN) injection 4 mg ( Intravenous MAR Unhold 05/27/19 0938)  pantoprazole (PROTONIX) 80 mg in sodium chloride 0.9 % 100 mL IVPB (0 mg Intravenous Stopped 05/26/19 2003)  ondansetron (ZOFRAN) injection 4 mg (4 mg Intravenous Given 05/26/19 1916)  0.9 %  sodium chloride infusion (0 mL/hr Intravenous Stopped 05/26/19 2329)  sodium chloride 0.9 % bolus 1,000 mL (0 mLs Intravenous Stopped 05/26/19 2239)  sodium chloride 0.9 % bolus 1,000 mL (0 mLs Intravenous Stopped 05/26/19 2329)  potassium chloride SA (K-DUR) CR tablet 40 mEq (40 mEq Oral Given 05/27/19 1122)    Mobility  High fall risk   Focused Assessments    R Recommendations: See Admitting Provider Note  Report given to:   Additional Notes:

## 2019-05-27 NOTE — ED Notes (Signed)
Patient placed in hospital bed

## 2019-05-27 NOTE — Anesthesia Procedure Notes (Signed)
Procedure Name: MAC Date/Time: 05/27/2019 9:04 AM Performed by: Kathryne Hitch, CRNA Pre-anesthesia Checklist: Patient identified, Emergency Drugs available, Suction available and Patient being monitored Oxygen Delivery Method: Nasal cannula Preoxygenation: Pre-oxygenation with 100% oxygen Induction Type: IV induction Dental Injury: Teeth and Oropharynx as per pre-operative assessment

## 2019-05-27 NOTE — ED Notes (Signed)
Handoff report given

## 2019-05-27 NOTE — Consult Note (Signed)
[facility] Consult  Referring Provider: Frederica Kuster, PA-C/ER Primary Care Physician:  Haywood Pao, MD Primary Gastroenterologist: Althia Forts  Reason for Consultation: Dark stools and symptomatic anemia  HPI: Abigail Shannon is a 83 y.o. female was in the ER 1 week ago with complains of epigastric pain and dark stools.  She also complained of headache and had a unremarkable right upper quadrant ultrasound and CAT scan of the abdomen and pelvis and was subsequently discharged from the ED. patient is on Naprosyn for joint pain and presented to the ED yesterday with continued epigastric pain associated with lightheadedness on standing.  Her labs showed that her hemoglobin was 7.1 on admission, was 9.9 on 05/19/2019 and the baseline hemoglobin runs between 12-13.  She had orthostatic hypotension and was given 2 units of PRBC in the ER yesterday. She states she has had a colonoscopy over 10 years ago, cannot remember where it was done and what the results were. Denies having an endoscopy. Patient denies acid reflux, heartburn, difficulty swallowing or pain on swallowing. Denies unintentional weight loss or loss of appetite. He has noticed that his stools are dark but denies noticing frank blood in stool.   Past Medical History:  Diagnosis Date  . Hypertension     Past Surgical History:  Procedure Laterality Date  . BREAST EXCISIONAL BIOPSY Left    2 benign breast biopsies years ago  . BREAST SURGERY    . KNEE SURGERY      Prior to Admission medications   Medication Sig Start Date End Date Taking? Authorizing Provider  amLODipine (NORVASC) 5 MG tablet Take 5 mg by mouth daily.  03/02/19  Yes [provider]  atorvastatin (LIPITOR) 40 MG tablet Take 40 mg by mouth daily.   Yes [provider]  Cholecalciferol (VITAMIN D3) 50 MCG (2000 UT) TABS Take 2,000 Units by mouth daily with breakfast.   Yes [provider]  diphenhydrAMINE (BENADRYL) 25  MG tablet Take 1 tablet (25 mg total) by mouth every 6 (six) hours. Patient taking differently: Take 25 mg by mouth every 6 (six) hours as needed for itching or allergies (or allergic reactions).  04/12/18  Yes Varney Biles, MD  hydrochlorothiazide (HYDRODIURIL) 25 MG tablet Take 1 tablet (25 mg total) by mouth daily. 04/12/18  Yes Nanavati, Ankit, MD  losartan (COZAAR) 100 MG tablet Take 100 mg by mouth daily.   Yes [provider]  metoCLOPramide (REGLAN) 10 MG tablet Take 1 tablet (10 mg total) by mouth every 6 (six) hours as needed for nausea (nausea/headache). 05/19/19  Yes Drenda Freeze, MD  naproxen sodium (ANAPROX) 220 MG tablet Take 220 mg by mouth every 8 (eight) hours as needed (knee pain).   Yes [provider]  amLODipine (NORVASC) 10 MG tablet Take 1 tablet (10 mg total) by mouth daily. Patient not taking: Reported on 05/26/2019 03/31/17   Gareth Morgan, MD  cephALEXin (KEFLEX) 500 MG capsule Take 1 capsule (500 mg total) by mouth 4 (four) times daily. Patient not taking: Reported on 05/26/2019 03/28/17   Virgel Manifold, MD  HYDROcodone-acetaminophen (NORCO/VICODIN) 5-325 MG tablet Take 1 tablet by mouth every 4 (four) hours as needed. Patient not taking: Reported on 05/26/2019 03/28/17   Virgel Manifold, MD  predniSONE (DELTASONE) 10 MG tablet Take 5 tablets (50 mg total) by mouth daily. Patient not taking: Reported on 05/26/2019 04/12/18   Varney Biles, MD    Current Facility-Administered Medications  Medication Dose Route Frequency Provider Last Rate  Last Dose  . 0.9 %  sodium chloride infusion   Intravenous Continuous Rise Patience, MD 100 mL/hr at 05/26/19 2329    . 0.9 %  sodium chloride infusion   Intravenous Continuous Ronnette Juniper, MD      . Doug Sou Hold] acetaminophen (TYLENOL) tablet 650 mg  650 mg Oral Q6H PRN Rise Patience, MD       Or  . Doug Sou Hold] acetaminophen (TYLENOL) suppository 650 mg  650 mg Rectal Q6H PRN Rise Patience, MD       . Doug Sou Hold] ondansetron [facility]) tablet 4 mg  4 mg Oral Q6H PRN Rise Patience, MD       Or  . Doug Sou Hold] ondansetron [facility]) injection 4 mg  4 mg Intravenous Q6H PRN Rise Patience, MD      . pantoprazole (PROTONIX) 80 mg in sodium chloride 0.9 % 250 mL (0.32 mg/mL) infusion  8 mg/hr Intravenous Continuous Rise Patience, MD 25 mL/hr at 05/27/19 0617 8 mg/hr at 05/27/19 0617    Allergies as of 05/26/2019 - Review Complete 05/26/2019  Allergen Reaction Noted  . Aspirin Anaphylaxis and Swelling 03/28/2017  . Bee venom Anaphylaxis and Swelling 05/26/2019    Family History  Family history unknown: Yes    Social History   Socioeconomic History  . Marital status: Widowed    Spouse name: Not on file  . Number of children: Not on file  . Years of education: Not on file  . Highest education level: Not on file  Occupational History  . Not on file  Social Needs  . Financial resource strain: Not on file  . Food insecurity    Worry: Not on file    Inability: Not on file  . Transportation needs    Medical: Not on file    Non-medical: Not on file  Tobacco Use  . Smoking status: Never Smoker  . Smokeless tobacco: Never Used  Substance and Sexual Activity  . Alcohol use: No  . Drug use: No  . Sexual activity: Not on file  Lifestyle  . Physical activity    Days per week: Not on file    Minutes per session: Not on file  . Stress: Not on file  Relationships  . Social Herbalist on phone: Not on file    Gets together: Not on file    Attends religious service: Not on file    Active member of club or organization: Not on file    Attends meetings of clubs or organizations: Not on file    Relationship status: Not on file  . Intimate partner violence    Fear of current or ex partner: Not on file    Emotionally abused: Not on file    Physically abused: Not on file    Forced sexual activity: Not on file  Other Topics Concern  . Not on file  Social  History Narrative  . Not on file    Review of Systems: Positive for: GI: Described in detail in HPI.    Gen: fatigue, weakness, malaise,Denies any fever, chills, rigors, night sweats, anorexia,  involuntary weight loss, and sleep disorder CV: Denies chest pain, angina, palpitations, syncope, orthopnea, PND, peripheral edema, and claudication. Resp: Denies dyspnea, cough, sputum, wheezing, coughing up blood. GU : Denies urinary burning, blood in urine, urinary frequency, urinary hesitancy, nocturnal urination, and urinary incontinence. MS: Denies joint pain or swelling.  Denies muscle weakness, cramps, atrophy.  Derm: Denies  rash, itching, oral ulcerations, hives, unhealing ulcers.  Psych: Denies depression, anxiety, memory loss, suicidal ideation, hallucinations,  and confusion. Heme: Denies bruising, bleeding, and enlarged lymph nodes. Neuro:   dizziness, Denies any headachesparesthesias. Endo:  Denies any problems with DM, thyroid, adrenal function.  Physical Exam: Vital signs in last 24 hours: Temp:  [97.7 F (36.5 C)-99.7 F (37.6 C)] 97.9 F (36.6 C) (09/18 0752) Pulse Rate:  [60-82] 71 (09/18 0752) Resp:  [16-30] 23 (09/18 0752) BP: (77-136)/(38-76) 128/54 (09/18 0752) SpO2:  [95 %-100 %] 99 % (09/18 0752) Weight:  [65.8 kg] 65.8 kg (09/17 1610)    General:   Alert,  Well-developed, well-nourished, pleasant and cooperative in NAD Head:  Normocephalic and atraumatic. Eyes:  Sclera clear, no icterus.  Mild pallor Ears:  Normal auditory acuity. Nose:  No deformity, discharge,  or lesions. Mouth:  No deformity or lesions.  Oropharynx pink & moist. Neck:  Supple; no masses or thyromegaly. Lungs:  Clear throughout to auscultation.   No wheezes, crackles, or rhonchi. No acute distress. Heart:  Regular rate and rhythm; no murmurs, clicks, rubs,  or gallops. Extremities:  Without clubbing or edema. Neurologic:  Alert and  oriented x4;  grossly normal neurologically. Skin:   Intact without significant lesions or rashes. Psych:  Alert and cooperative. Normal mood and affect. Abdomen: Midline lower abdominal scar ,soft, nontender and nondistended. No masses, hepatosplenomegaly or hernias noted. Normal bowel sounds, without guarding, and without rebound.     Rectal exam performed in the ER showed dark stool and was FOBT positive.    Lab Results: Recent Labs    05/26/19 1616 05/27/19 0432  WBC 9.3 8.8  HGB 7.1* 9.2*  HCT 21.4* 27.1*  PLT 212 144*   BMET Recent Labs    05/26/19 1616 05/27/19 0432  NA 143 146*  K 3.2* 3.3*  CL 108 117*  CO2 22 19*  GLUCOSE 126* 94  BUN 41* 32*  CREATININE 2.39* 1.70*  CALCIUM 8.8* 7.7*   LFT Recent Labs    05/26/19 1616  PROT 6.3*  ALBUMIN 3.2*  AST 14*  ALT 9  ALKPHOS 103  BILITOT 0.8   PT/INR Recent Labs    05/26/19 1903  LABPROT 15.7*  INR 1.3*    Studies/Results: Dg Abd Acute 2+v W 1v Chest  Result Date: 05/27/2019 CLINICAL DATA:  Blacked are stools EXAM: DG ABDOMEN ACUTE W/ 1V CHEST COMPARISON:  05/19/2019 FINDINGS: Cardiac shadows within normal limits. Mild aortic calcifications are seen. The lungs are well aerated without focal infiltrate. No bony abnormality is noted. Scattered large and small bowel gas is noted. No free air is seen. No mass lesion or abnormal calcifications are noted. Degenerative changes of lumbar spine are seen. IMPRESSION: No acute abnormality noted. Electronically Signed   By: Inez Catalina M.D.   On: 05/27/2019 01:11    Impression: Dark stools, FOBT positive, drop in hemoglobin, elevated BUN and creatinine suspicious for an upper GI bleed. Patient had orthostatic hypotension and has needed 2 units PRBC transfusion. Patient is now continued on IV Protonix drip. She has history of NSAID use.  Plan: EGD today. Risks and the benefits of the procedure were discussed with the patient in details. Verbalized understanding and consents.   LOS: 1 day   Ronnette Juniper, MD   05/27/2019, 7:58 AM  Pager [phone] If no answer or after 5 PM call [phone]

## 2019-05-27 NOTE — Op Note (Signed)
[facility] Patient Name: Abigail Shannon Procedure Date : 05/27/2019 MRN: [id number] Attending MD: Ronnette Juniper , MD Date of Birth: July 13, 1936 CSN: [id number] Age: 83 Admit Type: Inpatient Procedure:                Upper GI endoscopy Indications:              Acute post hemorrhagic anemia, Melena Providers:                Ronnette Juniper, MD, Raynelle Bring, RN, William Dalton,                            Technician Referring MD:              Medicines:                Monitored Anesthesia Care Complications:            No immediate complications. Estimated blood loss:                            Minimal. Estimated Blood Loss:     Estimated blood loss was minimal. Procedure:                Pre-Anesthesia Assessment:                           - Prior to the procedure, a History and Physical                            was performed, and patient medications and                            allergies were reviewed. The patient's tolerance of                            previous anesthesia was also reviewed. The risks                            and benefits of the procedure and the sedation                            options and risks were discussed with the patient.                            All questions were answered, and informed consent                            was obtained. Prior Anticoagulants: The patient has                            taken no previous anticoagulant or antiplatelet                            agents. ASA Grade Assessment: III - A patient with  severe systemic disease. After reviewing the risks                            and benefits, the patient was deemed in                            satisfactory condition to undergo the procedure.                           After obtaining informed consent, the endoscope was                            passed under direct vision. Throughout the                            procedure, the patient's blood  pressure, pulse, and                            oxygen saturations were monitored continuously. The                            [id number]) Olympus gastroscope was                            introduced through the mouth, and advanced to the                            second part of duodenum. The upper GI endoscopy was                            accomplished without difficulty. The patient                            tolerated the procedure well. Scope In: Scope Out: Findings:      The examined esophagus was normal.      A widely patent Schatzki ring was found at the gastroesophageal junction.      A 4 cm hiatal hernia was present.      Diffuse moderately erythematous mucosa with bleeding on contact was       found in the gastric body. Coagulation for hemostasis using bipolar       probe was successful.      One non-bleeding cratered gastric ulcer with pigmented material was       found at the pylorus. The lesion was 20 mm in largest dimension.       Coagulation for hemostasis using bipolar probe was successful.      Six non-bleeding cratered and superficial gastric ulcers with a clean       ulcer base (Forrest Class III) were found in the gastric antrum. The       largest lesion was 6 mm in largest dimension.      Localized moderately erythematous mucosa without bleeding was found in       the gastric antrum. Biopsies were taken with a cold forceps for       Helicobacter pylori testing.      The cardia and gastric fundus were  normal on retroflexion.      The examined duodenum was normal. Impression:               - Normal esophagus.                           - Widely patent Schatzki ring.                           - 4 cm hiatal hernia.                           - Erythematous mucosa in the gastric body. Treated                            with bipolar cautery.                           - Non-bleeding gastric ulcer with pigmented                            material. Treated with  bipolar cautery.                           - Non-bleeding gastric ulcers with a clean ulcer                            base (Forrest Class III).                           - Erythematous mucosa in the antrum. Biopsied.                           - Normal examined duodenum. Moderate Sedation:      Patient did not receive moderate sedation for this procedure, but       instead received monitored anesthesia care. Recommendation:           - Give Protonix (pantoprazole): 8 mg/hr IV by                            continuous infusion today.                           - Use Protonix (pantoprazole) 40 mg PO BID for 8                            weeks from tomorrow.                           - Full liquid diet.                           - No aspirin, ibuprofen, naproxen, or other                            non-steroidal anti-inflammatory drugs.                           -  Await pathology results. Procedure Code(s):        --- Professional ---                           [phone], 88, Esophagogastroduodenoscopy, flexible,                            transoral; with control of bleeding, any method                           43239, Esophagogastroduodenoscopy, flexible,                            transoral; with biopsy, single or multiple Diagnosis Code(s):        --- Professional ---                           K22.2, Esophageal obstruction                           K44.9, Diaphragmatic hernia without obstruction or                            gangrene                           K31.89, Other diseases of stomach and duodenum                           K25.9, Gastric ulcer, unspecified as acute or                            chronic, without hemorrhage or perforation                           D62, Acute posthemorrhagic anemia                           K92.1, Melena (includes Hematochezia) CPT copyright 2019 American Medical Association. All rights reserved. The codes documented in this report are preliminary and  upon coder review may  be revised to meet current compliance requirements. Ronnette Juniper, MD 05/27/2019 9:21:46 AM This report has been signed electronically. Number of Addenda: 0

## 2019-05-28 ENCOUNTER — Encounter (HOSPITAL_COMMUNITY): Payer: Self-pay | Admitting: Gastroenterology

## 2019-05-28 ENCOUNTER — Other Ambulatory Visit: Payer: Self-pay

## 2019-05-28 LAB — GLUCOSE, CAPILLARY
Glucose-Capillary: 103 mg/dL — ABNORMAL HIGH (ref 70–99)
Glucose-Capillary: 84 mg/dL (ref 70–99)
Glucose-Capillary: 86 mg/dL (ref 70–99)

## 2019-05-28 LAB — BASIC METABOLIC PANEL
Anion gap: 9 (ref 5–15)
BUN: 15 mg/dL (ref 8–23)
CO2: 22 mmol/L (ref 22–32)
Calcium: 8.4 mg/dL — ABNORMAL LOW (ref 8.9–10.3)
Chloride: 113 mmol/L — ABNORMAL HIGH (ref 98–111)
Creatinine, Ser: 1.19 mg/dL — ABNORMAL HIGH (ref 0.44–1.00)
GFR calc Af Amer: 50 mL/min — ABNORMAL LOW (ref >60)
GFR calc non Af Amer: 43 mL/min — ABNORMAL LOW (ref >60)
Glucose, Bld: 103 mg/dL — ABNORMAL HIGH (ref 70–99)
Potassium: 3.9 mmol/L (ref 3.5–5.1)
Sodium: 144 mmol/L (ref 135–145)

## 2019-05-28 LAB — CBC
HCT: 28.8 % — ABNORMAL LOW (ref 36.0–46.0)
Hemoglobin: 9.6 g/dL — ABNORMAL LOW (ref 12.0–15.0)
MCH: 30.6 pg (ref 26.0–34.0)
MCHC: 33.3 g/dL (ref 30.0–36.0)
MCV: 91.7 fL (ref 80.0–100.0)
Platelets: 148 10*3/uL — ABNORMAL LOW (ref 150–400)
RBC: 3.14 MIL/uL — ABNORMAL LOW (ref 3.87–5.11)
RDW: 15.9 % — ABNORMAL HIGH (ref 11.5–15.5)
WBC: 8.8 10*3/uL (ref 4.0–10.5)
nRBC: 0 % (ref 0.0–0.2)

## 2019-05-28 MED ORDER — PANTOPRAZOLE SODIUM 40 MG PO TBEC: 40.0000 mg | DELAYED_RELEASE_TABLET | Freq: Two times a day (BID) | ORAL | 0 refills | Status: AC

## 2019-05-28 MED ORDER — ACETAMINOPHEN 325 MG PO TABS: 650.0000 mg | ORAL_TABLET | Freq: Four times a day (QID) | ORAL | Status: AC | PRN

## 2019-05-28 MED ORDER — PANTOPRAZOLE SODIUM 40 MG PO TBEC
40.0000 mg | DELAYED_RELEASE_TABLET | Freq: Two times a day (BID) | ORAL | Status: DC
  Administered 2019-05-28: 40 mg via ORAL
  Filled 2019-05-28: qty 1

## 2019-05-28 NOTE — Evaluation (Signed)
Physical Therapy Evaluation Patient Details Name: Abigail Shannon MRN: [id number] DOB: Nov 16, 1935 Today's Date: 05/28/2019   History of Present Illness  Pt is an 83 y.o. female admitted for upper GI bleed.  Clinical Impression  PT eval complete. Pt required min guard assist transfers and ambulation 175 feet without AD. Pt with decreased safety awareness and increased risk for falls. Recommend family stay with pt upon initial discharge home. Plan is for d/c home today. All further needs to be address by HHPT. No DME needs. PT signing off.     Follow Up Recommendations Home health PT;Supervision/Assistance - 24 hour    Equipment Recommendations  None recommended by PT    Recommendations for Other Services       Precautions / Restrictions Precautions Precautions: Fall      Mobility  Bed Mobility Overal bed mobility: Modified Independent             General bed mobility comments: HOB elevated  Transfers Overall transfer level: Needs assistance Equipment used: None Transfers: Sit to/from [employer] Transfers Sit to Stand: Min guard Stand pivot transfers: Min guard       General transfer comment: min guard for safety  Ambulation/Gait Ambulation/Gait assistance: Min guard Gait Distance (Feet): 175 Feet Assistive device: None Gait Pattern/deviations: Step-through pattern;Wide base of support;Decreased stride length;Trunk flexed Gait velocity: mildly decreased Gait velocity interpretation: 1.31 - 2.62 ft/sec, indicative of limited community ambulator General Gait Details: unsteady on feet but no overt LOB. Pt declining need for AD. Min guard assist for safety.  Stairs Stairs: (Pt declined stairs, stating "I won't have no problem getting in my house. I'm not worried about that.")          Wheelchair Mobility    Modified Rankin (Stroke Patients Only)       Balance Overall balance assessment: Needs assistance Sitting-balance support: No upper  extremity supported;Feet supported Sitting balance-Leahy Scale: Good     Standing balance support: No upper extremity supported;During functional activity Standing balance-Leahy Scale: Fair Standing balance comment: amb without AD, unsteady                             Pertinent Vitals/Pain Pain Assessment: No/denies pain    Home Living Family/patient expects to be discharged to:: Private residence Living Arrangements: Alone Available Help at Discharge: Family;Available 24 hours/day Type of Home: House Home Access: Stairs to enter Entrance Stairs-Rails: Psychiatric nurse of Steps: 2 Home Layout: One level Home Equipment: Walker - 2 wheels      Prior Function Level of Independence: Independent         Comments: family ocassionally assist with iADLs.     Hand Dominance        Extremity/Trunk Assessment   Upper Extremity Assessment Upper Extremity Assessment: Overall WFL for tasks assessed    Lower Extremity Assessment Lower Extremity Assessment: Generalized weakness    Cervical / Trunk Assessment Cervical / Trunk Assessment: Kyphotic  Communication   Communication: No difficulties  Cognition Arousal/Alertness: Awake/alert Behavior During Therapy: WFL for tasks assessed/performed Overall Cognitive Status: History of cognitive impairments - at baseline                                 General Comments: mild confusion. Decreased safety awareness. Pleasant. Eager to go home.      General Comments      Exercises  Assessment/Plan    PT Assessment All further PT needs can be met in the next venue of care  PT Problem List Decreased strength;Decreased mobility;Decreased safety awareness;Decreased balance;Decreased activity tolerance       PT Treatment Interventions      PT Goals (Current goals can be found in the Care Plan section)  Acute Rehab PT Goals Patient Stated Goal: home today PT Goal Formulation: All  assessment and education complete, DC therapy    Frequency     Barriers to discharge        Co-evaluation               AM-PAC PT "6 Clicks" Mobility  Outcome Measure Help needed turning from your back to your side while in a flat bed without using bedrails?: None Help needed moving from lying on your back to sitting on the side of a flat bed without using bedrails?: None Help needed moving to and from a bed to a chair (including a wheelchair)?: A Little Help needed standing up from a chair using your arms (e.g., wheelchair or bedside chair)?: A Little Help needed to walk in hospital room?: A Little Help needed climbing 3-5 steps with a railing? : A Little 6 Click Score: 20    End of Session Equipment Utilized During Treatment: Gait belt Activity Tolerance: Patient tolerated treatment well Patient left: in bed;with call bell/phone within reach;with bed alarm set Nurse Communication: Mobility status PT Visit Diagnosis: Unsteadiness on feet (R26.81)    Time: 4239-5320 PT Time Calculation (min) (ACUTE ONLY): 15 min   Charges:   PT Evaluation $PT Eval Low Complexity: 1 Low          Lorrin Goodell, PT  Office # [phone] Pager [phone]   Lorriane Shire 05/28/2019, 2:04 PM

## 2019-05-28 NOTE — Discharge Summary (Addendum)
Physician Discharge Summary  Abigail Shannon NFA:[id number] DOB: 1936/07/10 DOA: 05/26/2019  PCP: Gaspar Garbe, MD  Admit date: 05/26/2019 Discharge date: 05/28/2019  Time spent: 35 minutes  Recommendations for Outpatient Follow-up:  1. PCP in 1 week, please check CBC at follow-up, monitor BP 2. GI Dr. Marca Ancona in 1 month, follow-up biopsies and H. pylori   Discharge Diagnoses:  Peptic ulcer disease Gastric ulcers Acute blood loss anemia Upper GI bleeding Acute kidney injury Essential hypertension:    Discharge Condition: improved  Diet recommendation: Low-sodium  Filed Weights   05/26/19 1610  Weight: 65.8 kg    History of present illness:  Abigail B Parkeris a 83 y.o.femalewithhistory of hypertension hyperlipidemia arthritis on Aleve had come to the ER on September 10 with complaints of abdominal pain dizziness and headache at that time CT abdomen and CT head and ultrasound were done which largely were unremarkable and patient was discharged home.  Patient also has been noticing black stools over the last 1 week. Patient was taken to her primary care physician by her daughter and over the had blood work done which showed low hemoglobin and was advised to come to the ER. In the ER on arrival patient's blood pressure was in the low 90s and at times had dropped below 90s. Was given fluid bolus following which blood pressure improved. Stool for occult blood was positive hemoglobin was 7.1  Hospital Course:   Upper GI bleed, acute blood loss anemia, melena -Admitted with symptomatic anemia, hypotension weakness and dizziness -Given 2 units of PRBC, gastroenterology consulted EGD yesterday showed diffusely erythematous mucosa with contact bleeding in gastric body treated with bipolar cautery to stop using, 1 nonbleeding cratered gastric ulcer noted at the pylorus, 2 pigmented spots treated with bipolar cautery, 6 nonbleeding cratered superficial gastric ulcers noted in the  gastric antrum, biopsies taken for H. Pylori. -Patient remained stable overnight, hemoglobin was stable tolerated liquid diet -Hemoglobin is 9.6 today, she was given IV iron -Discharged home in a stable condition on Protonix 40 mg twice daily, advised to stop NSAIDs and to use Tylenol instead for aches and pains  Essential hypertension -Pressure improved from admission, in the 120s range now, restarted amlodipine, stopped losartan  Acute kidney injury -Due to NSAIDs blood loss and hypotension -Improved after blood transfusion -Losartan discontinued, can be restarted if blood pressure trends up at follow-up  Discharge Exam: Vitals:   05/28/19 1000 05/28/19 1315  BP:  116/77  Pulse: 75 75  Resp:  18  Temp:  98 F (36.7 C)  SpO2:  100%    General: AAOx3 Cardiovascular: S1S2/RRR Respiratory: CTAB  Discharge Instructions   Discharge Instructions    Diet - low sodium heart healthy   Complete by: As directed    Increase activity slowly   Complete by: As directed      Allergies as of 05/28/2019      Reactions   Aspirin Anaphylaxis, Swelling   Bee Venom Anaphylaxis, Swelling      Medication List    STOP taking these medications   cephALEXin 500 MG capsule Commonly known as: KEFLEX   HYDROcodone-acetaminophen 5-325 MG tablet Commonly known as: NORCO/VICODIN   losartan 100 MG tablet Commonly known as: COZAAR   metoCLOPramide 10 MG tablet Commonly known as: Reglan   naproxen sodium 220 MG tablet Commonly known as: ALEVE   predniSONE 10 MG tablet Commonly known as: DELTASONE     TAKE these medications   acetaminophen 325 MG tablet Commonly known  as: TYLENOL Take 2 tablets (650 mg total) by mouth every 6 (six) hours as needed for mild pain (or Fever >/= 101).   amLODipine 5 MG tablet Commonly known as: NORVASC Take 5 mg by mouth daily. What changed: Another medication with the same name was removed. Continue taking this medication, and follow the directions  you see here.   atorvastatin 40 MG tablet Commonly known as: LIPITOR Take 40 mg by mouth daily.   diphenhydrAMINE 25 MG tablet Commonly known as: BENADRYL Take 1 tablet (25 mg total) by mouth every 6 (six) hours. What changed:   when to take this  reasons to take this   hydrochlorothiazide 25 MG tablet Commonly known as: HYDRODIURIL Take 1 tablet (25 mg total) by mouth daily.   pantoprazole 40 MG tablet Commonly known as: PROTONIX Take 1 tablet (40 mg total) by mouth 2 (two) times daily.   Vitamin D3 50 MCG (2000 UT) Tabs Take 2,000 Units by mouth daily with breakfast.      Allergies  Allergen Reactions  . Aspirin Anaphylaxis and Swelling  . Bee Venom Anaphylaxis and Swelling   Follow-up Information    Tisovec, Adelfa Koh, MD Follow up in 1 week(s).   Specialty: Internal Medicine Contact information: [address] [place] [address] [phone]            The results of significant diagnostics from this hospitalization (including imaging, microbiology, ancillary and laboratory) are listed below for reference.    Significant Diagnostic Studies: Ct Head Wo Contrast  Result Date: 05/19/2019 CLINICAL DATA:  Headache EXAM: CT HEAD WITHOUT CONTRAST TECHNIQUE: Contiguous axial images were obtained from the base of the skull through the vertex without intravenous contrast. COMPARISON:  04/18/2017 FINDINGS: Brain: Mild age related volume loss. No acute intracranial abnormality. Specifically, no hemorrhage, hydrocephalus, mass lesion, acute infarction, or significant intracranial injury. Vascular: No hyperdense vessel or unexpected calcification. Skull: No acute calvarial abnormality. Sinuses/Orbits: Visualized paranasal sinuses and mastoids clear. Orbital soft tissues unremarkable. Other: None IMPRESSION: No acute intracranial abnormality. Electronically Signed   By: Charlett Nose M.D.   On: 05/19/2019 20:51   Ct Abdomen Pelvis W Contrast  Result Date:  05/19/2019 CLINICAL DATA:  Abdominal distension EXAM: CT ABDOMEN AND PELVIS WITH CONTRAST TECHNIQUE: Multidetector CT imaging of the abdomen and pelvis was performed using the standard protocol following bolus administration of intravenous contrast. CONTRAST:  75mL OMNIPAQUE IOHEXOL 300 MG/ML  SOLN COMPARISON:  None. FINDINGS: The study is somewhat limited due to patient motion. Lower chest: The visualized heart size within normal limits. No pericardial fluid/thickening. No hiatal hernia. There is a 7 mm nodule seen in the anterior left lung base. There is asymmetric skin thickening over the left breast. Hepatobiliary: The liver is normal in density without focal abnormality.The main portal vein is patent. There is question of mild fat stranding changes seen around the gallbladder neck. No definite pericholecystic fluid or layering calculi are seen. Pancreas: Unremarkable. No pancreatic ductal dilatation or surrounding inflammatory changes. Spleen: Normal in size without focal abnormality. Adrenals/Urinary Tract: Both adrenal glands appear normal. There are bilateral low-density lesions seen within both kidneys the largest measuring 2 cm in the upper pole of the left kidney. No hydronephrosis is seen. Stomach/Bowel: The stomach, small bowel, and colon are normal in appearance. No inflammatory changes, wall thickening, or obstructive findings. Vascular/Lymphatic: There are no enlarged mesenteric, retroperitoneal, or pelvic lymph nodes. Scattered aortic atherosclerotic calcifications are seen without aneurysmal dilatation. Reproductive: The uterus and adnexa are unremarkable.  Other: No evidence of abdominal wall mass or hernia. Musculoskeletal: No acute or significant osseous findings. IMPRESSION: 1. Asymmetric left breast skin thickening as on the recent ultrasound. 2. 7 mm nodule seen in the anterior left lung base. Non-contrast chest CT at 6-12 months is recommended. If the nodule is stable at time of repeat CT,  then future CT at 18-24 months (from today's scan) is considered optional for low-risk patients, but is recommended for high-risk patients. This recommendation follows the consensus statement: Guidelines for Management of Incidental Pulmonary Nodules Detected on CT Images: From the Fleischner Society 2017; Radiology 2017; 284:228-243. 3. Somewhat limited due to patient motion, however there is question of mild fat stranding changes seen around the gallbladder neck. Please correlate with the patient's clinical laboratory exam. If clinical concern remains, would recommend right upper quadrant ultrasound. Electronically Signed   By: Jonna Clark M.D.   On: 05/19/2019 21:13   Dg Abd Acute 2+v W 1v Chest  Result Date: 05/27/2019 CLINICAL DATA:  Blacked are stools EXAM: DG ABDOMEN ACUTE W/ 1V CHEST COMPARISON:  05/19/2019 FINDINGS: Cardiac shadows within normal limits. Mild aortic calcifications are seen. The lungs are well aerated without focal infiltrate. No bony abnormality is noted. Scattered large and small bowel gas is noted. No free air is seen. No mass lesion or abnormal calcifications are noted. Degenerative changes of lumbar spine are seen. IMPRESSION: No acute abnormality noted. Electronically Signed   By: Alcide Clever M.D.   On: 05/27/2019 01:11   US Breast Ltd Uni Left Inc Axilla  Result Date: 05/12/2019 CLINICAL DATA:  The patient presented with left retroareolar pain in July of 2018. During workup, it was noted she had skin thickening. 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 The skin thickening was eventually biopsied with a skin punch biopsy demonstrating a benign process. She presents with new onset erythema and pain. She was put on antibiotics 2 days ago by her referring physician and has noted improvement. EXAM: DIGITAL DIAGNOSTIC BILATERAL MAMMOGRAM WITH CAD AND TOMO ULTRASOUND LEFT BREAST COMPARISON:  Previous exam(s). ACR Breast Density Category b: There are scattered areas of  fibroglandular density. FINDINGS: No suspicious masses, calcifications, or changes are identified in either breast. Skin thickening is again identified on the left, increased since 2018. Distortion on the left is at the site of previous surgery and is similar in the interval. Mammographic images were processed with CAD. On physical exam, erythema and skin thickening is noted. No suspicious lumps are identified. Targeted ultrasound is performed, showing no abscess or suspicious mass. IMPRESSION: No abscess. Probable mastitis. The previously identified skin thickening was noted to be benign after punch biopsy. The increase in skin thickening today may be due to superimposed mastitis. RECOMMENDATION: Recommend 2 week follow-up mammogram of the left breast to ensure return of the skin thickening to its baseline level of thickening which was previously noted to be benign based on punch biopsy. I have discussed the findings and recommendations with the patient. If applicable, a reminder letter will be sent to the patient regarding the next appointment. BI-RADS CATEGORY  3: Probably benign. Electronically Signed   By: Gerome Sam III M.D   On: 05/12/2019 15:53   Mm Diag Breast Tomo Bilateral  Result Date: 05/12/2019 CLINICAL DATA:  The patient presented with left retroareolar pain in July of 2018. During workup, it was noted she had skin thickening. 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 The skin thickening was eventually biopsied with a skin punch biopsy  demonstrating a benign process. She presents with new onset erythema and pain. She was put on antibiotics 2 days ago by her referring physician and has noted improvement. EXAM: DIGITAL DIAGNOSTIC BILATERAL MAMMOGRAM WITH CAD AND TOMO ULTRASOUND LEFT BREAST COMPARISON:  Previous exam(s). ACR Breast Density Category b: There are scattered areas of fibroglandular density. FINDINGS: No suspicious masses, calcifications, or changes are identified in either  breast. Skin thickening is again identified on the left, increased since 2018. Distortion on the left is at the site of previous surgery and is similar in the interval. Mammographic images were processed with CAD. On physical exam, erythema and skin thickening is noted. No suspicious lumps are identified. Targeted ultrasound is performed, showing no abscess or suspicious mass. IMPRESSION: No abscess. Probable mastitis. The previously identified skin thickening was noted to be benign after punch biopsy. The increase in skin thickening today may be due to superimposed mastitis. RECOMMENDATION: Recommend 2 week follow-up mammogram of the left breast to ensure return of the skin thickening to its baseline level of thickening which was previously noted to be benign based on punch biopsy. I have discussed the findings and recommendations with the patient. If applicable, a reminder letter will be sent to the patient regarding the next appointment. BI-RADS CATEGORY  3: Probably benign. Electronically Signed   By: Gerome Sam III M.D   On: 05/12/2019 15:53   US Abdomen Limited Ruq  Result Date: 05/19/2019 CLINICAL DATA:  Right upper quadrant pain, EXAM: ULTRASOUND ABDOMEN LIMITED RIGHT UPPER QUADRANT COMPARISON:  None. FINDINGS: Gallbladder: No gallstones or wall thickening visualized. No sonographic Murphy sign noted by sonographer. No gallbladder wall thickening. Common bile duct: Diameter: 5 mm Liver: No focal lesion identified. Within normal limits in parenchymal echogenicity. Portal vein is patent on color Doppler imaging with normal direction of blood flow towards the liver. Other: None. IMPRESSION: Normal gallbladder and liver. Electronically Signed   By: Jonna Clark M.D.   On: 05/19/2019 21:58    Microbiology: Recent Results (from the past 240 hour(s))  SARS Coronavirus 2 [facility] order, Performed in [facility] hospital lab) Nasopharyngeal Nasopharyngeal Swab     Status: None   Collection Time:  05/26/19  8:05 PM   Specimen: Nasopharyngeal Swab  Result Value Ref Range Status   SARS Coronavirus 2 NEGATIVE NEGATIVE Final    Comment: (NOTE) If result is NEGATIVE SARS-CoV-2 target nucleic acids are NOT DETECTED. The SARS-CoV-2 RNA is generally detectable in upper and lower  respiratory specimens during the acute phase of infection. The lowest  concentration of SARS-CoV-2 viral copies this assay can detect is 250  copies / mL. A negative result does not preclude SARS-CoV-2 infection  and should not be used as the sole basis for treatment or other  patient management decisions.  A negative result may occur with  improper specimen collection / handling, submission of specimen other  than nasopharyngeal swab, presence of viral mutation(s) within the  areas targeted by this assay, and inadequate number of viral copies  (<250 copies / mL). A negative result must be combined with clinical  observations, patient history, and epidemiological information. If result is POSITIVE SARS-CoV-2 target nucleic acids are DETECTED. The SARS-CoV-2 RNA is generally detectable in upper and lower  respiratory specimens dur ing the acute phase of infection.  Positive  results are indicative of active infection with SARS-CoV-2.  Clinical  correlation with patient history and other diagnostic information is  necessary to determine patient infection status.  Positive results do  not rule out bacterial infection or co-infection with other viruses. If result is PRESUMPTIVE POSTIVE SARS-CoV-2 nucleic acids MAY BE PRESENT.   A presumptive positive result was obtained on the submitted specimen  and confirmed on repeat testing.  While 2019 novel coronavirus  (SARS-CoV-2) nucleic acids may be present in the submitted sample  additional confirmatory testing may be necessary for epidemiological  and / or clinical management purposes  to differentiate between  SARS-CoV-2 and other Sarbecovirus currently known to  infect humans.  If clinically indicated additional testing with an alternate test  methodology [id number]) is advised. The SARS-CoV-2 RNA is generally  detectable in upper and lower respiratory sp ecimens during the acute  phase of infection. The expected result is Negative. Fact Sheet for Patients:  BoilerBrush.com.cy Fact Sheet for Healthcare Providers: https://pope.com/ This test is not yet approved or cleared by the [place] FDA and has been authorized for detection and/or diagnosis of SARS-CoV-2 by FDA under an Emergency Use Authorization (EUA).  This EUA will remain in effect (meaning this test can be used) for the duration of the COVID-19 declaration under Section 564(b)(1) of the Act, 21 U.S.C. section 360bbb-3(b)(1), unless the authorization is terminated or revoked sooner. Performed at [facility], [address], [place], [place] [address]      Labs: Basic Metabolic Panel: Recent Labs  Lab 05/26/19 1616 05/27/19 0432 05/28/19 0607  NA 143 146* 144  K 3.2* 3.3* 3.9  CL 108 117* 113*  CO2 22 19* 22  GLUCOSE 126* 94 103*  BUN 41* 32* 15  CREATININE 2.39* 1.70* 1.19*  CALCIUM 8.8* 7.7* 8.4*   Liver Function Tests: Recent Labs  Lab 05/26/19 1616  AST 14*  ALT 9  ALKPHOS 103  BILITOT 0.8  PROT 6.3*  ALBUMIN 3.2*   No results for input(s): LIPASE, AMYLASE in the last 168 hours. No results for input(s): AMMONIA in the last 168 hours. CBC: Recent Labs  Lab 05/26/19 1616 05/27/19 0432 05/27/19 1003 05/27/19 1844 05/28/19 0607  WBC 9.3 8.8 7.9 9.0 8.8  NEUTROABS 7.0  --   --   --   --   HGB 7.1* 9.2* 9.1* 10.8* 9.6*  HCT 21.4* 27.1* 27.2* 34.0* 28.8*  MCV 93.4 90.9 91.3 94.2 91.7  PLT 212 144* 152 170 148*   Cardiac Enzymes: No results for input(s): CKTOTAL, CKMB, CKMBINDEX, TROPONINI in the last 168 hours. BNP: BNP (last 3 results) No results for input(s): BNP in the last 8760  hours.  ProBNP (last 3 results) No results for input(s): PROBNP in the last 8760 hours.  CBG: Recent Labs  Lab 05/27/19 1724 05/27/19 1826 05/28/19 0029 05/28/19 0636 05/28/19 1133  GLUCAP 70 84 86 84 103*       Signed:  Zannie Cove MD.  [facility] 05/28/2019, 2:03 PM

## 2019-05-28 NOTE — TOC Transition Note (Signed)
Transition of Care [facility]) - CM/SW Discharge Note   Patient Details  Name: Abigail Shannon MRN: [id number] Date of Birth: [birth year]  [facility] CM/SW Contact:  Claudie Leach, RN Phone Number: 05/28/2019, 12:09 PM   Clinical Narrative:    Pt to return home with assistance of family.  Patient lives alone but family frequently stay with patient and provides for needs.    Home health PT referral accepted by [facility].   Son reports that patient has a RW and other DME in her attic.  He will check to see if she may need 3n1 or any other DME.    Final next level of care: [place] Barriers to Discharge: No Barriers Identified    Discharge Plan and Services        HH Arranged: PT [facility] Agency: [place] ([place]) Date [place]: 05/28/19 Time [place]: 1208 Representative spoke with at [place]: Floydene Flock

## 2019-05-28 NOTE — Progress Notes (Addendum)
Called to room by CNA.  Pt was found attempting to climb out of bed.  Pt told CNA she was trying to get dressed and go home.  Pt had removed telemetry and SCDs.  States she feels like SCDs are clawing her.  Telemetry and SCDs placed.  Pt re-oriented.  Bed alarm set.    Mid-level notified new onset confusion.

## 2019-05-28 NOTE — Progress Notes (Signed)
Pt awaiting lunch and then can go home.  PT eval completed.  DC instructions given and reviewed with son.

## 2019-05-28 NOTE — Progress Notes (Signed)
Spoke with son, Juanda Crumble at [phone] and told him pt needed PT eval this am and then we would call him.

## 2019-05-30 LAB — SURGICAL PATHOLOGY

## 2019-09-19 ENCOUNTER — Encounter: Payer: Self-pay | Admitting: *Deleted

## 2019-09-20 ENCOUNTER — Encounter: Payer: Self-pay | Admitting: Diagnostic Neuroimaging

## 2019-09-20 ENCOUNTER — Other Ambulatory Visit: Payer: Self-pay

## 2019-09-20 ENCOUNTER — Ambulatory Visit: Payer: Medicare Other | Admitting: Diagnostic Neuroimaging

## 2019-09-20 VITALS — BP 155/83 | HR 71 | Temp 97.5°F | Ht 64.0 in | Wt 165.0 lb

## 2019-09-20 DIAGNOSIS — F0390 Unspecified dementia without behavioral disturbance: Secondary | ICD-10-CM | POA: Diagnosis not present

## 2019-09-20 DIAGNOSIS — F03A0 Unspecified dementia, mild, without behavioral disturbance, psychotic disturbance, mood disturbance, and anxiety: Secondary | ICD-10-CM

## 2019-09-20 MED ORDER — MEMANTINE HCL 10 MG PO TABS: 10.0000 mg | ORAL_TABLET | Freq: Two times a day (BID) | ORAL | 12 refills | Status: AC

## 2019-09-20 NOTE — Progress Notes (Signed)
[facility]  PATIENT: Abigail Shannon DOB: [birth year]  REFERRING CLINICIAN: Tisovec, Fransico Him, MD HISTORY FROM: patient and daughter  REASON FOR VISIT: new consult    HISTORICAL  CHIEF COMPLAINT:  Chief Complaint  Patient presents with  . Memory Loss    rm 6 New Pt, dgtr- Jessica   MMSE 21    HISTORY OF PRESENT ILLNESS:   84 year old female here for evaluation of memory loss.  For past 3 to 6 months patient has had gradual onset progressive short-term memory loss, confusion, disorientation with dates, confusion with a.m. and p.m., missing medications and missing appointments.  This is been noticed by family.  Patient does not feel any significant memory problems.  She lives alone.  She takes care of most of her ADLs but now has stopped driving at request of family.  Family also helping her with her medications and appointments.  She is able to take care of some household chores and her personal hygiene issues.  She does not go outside of the home by herself.  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   REVIEW OF SYSTEMS: Full 14 system review of systems performed and negative with exception of: As per HPI.  ALLERGIES: Allergies  Allergen Reactions  . Aspirin Anaphylaxis and Swelling  . Bee Venom Anaphylaxis and Swelling    HOME MEDICATIONS: Outpatient Medications Prior to Visit  Medication Sig Dispense Refill  . acetaminophen (TYLENOL) 325 MG tablet Take 2 tablets (650 mg total) by mouth every 6 (six) hours as needed for mild pain (or Fever >/= 101).    Marland Kitchen amLODipine (NORVASC) 5 MG tablet Take 5 mg by mouth daily.     Marland Kitchen atorvastatin (LIPITOR) 40 MG tablet Take 40 mg by mouth daily.    . diphenhydrAMINE (BENADRYL) 25 MG tablet Take 1 tablet (25 mg total) by mouth every 6 (six) hours. (Patient taking differently: Take 25 mg by mouth every 6 (six) hours as needed for itching or allergies (or  allergic reactions). ) 20 tablet 0  . hydrochlorothiazide (HYDRODIURIL) 25 MG tablet Take 1 tablet (25 mg total) by mouth daily. 30 tablet 0  . pantoprazole (PROTONIX) 40 MG tablet Take 1 tablet (40 mg total) by mouth 2 (two) times daily. 60 tablet 0  . Cholecalciferol (VITAMIN D3) 50 MCG (2000 UT) TABS Take 2,000 Units by mouth daily with breakfast.     No facility-administered medications prior to visit.    PAST MEDICAL HISTORY: Past Medical History:  Diagnosis Date  . Acute GI bleeding 05/2019  . CKD (chronic kidney disease), stage II   . Fibroadenoma of breast 2018   left  . Hyperlipidemia   . Hypertension   . Memory loss   . Pneumonia 2014  . Vitamin D deficiency     PAST SURGICAL HISTORY: Past Surgical History:  Procedure Laterality Date  . ANKLE FRACTURE SURGERY    . BIOPSY  05/27/2019   Procedure: BIOPSY;  Surgeon: Ronnette Juniper, MD;  Location: [facility];  Service: Gastroenterology;;  . BREAST EXCISIONAL BIOPSY Left    2 benign breast biopsies years ago  . BREAST SURGERY Left 2018  . ESOPHAGOGASTRODUODENOSCOPY (EGD) WITH PROPOFOL N/A 05/27/2019   Procedure: ESOPHAGOGASTRODUODENOSCOPY (EGD) WITH PROPOFOL;  Surgeon: Ronnette Juniper, MD;  Location: [facility];  Service: Gastroenterology;  Laterality: N/A;  . HEMOSTASIS CONTROL  05/27/2019   Procedure: HEMOSTASIS CONTROL;  Surgeon: Ronnette Juniper, MD;  Location:  [facility];  Service: Gastroenterology;;  . KNEE SURGERY Right     FAMILY HISTORY: Family History  Problem Relation Age of Onset  . Stomach cancer Mother   . Hypertension Sister     SOCIAL HISTORY: Social History   Socioeconomic History  . Marital status: Widowed    Spouse name: Not on file  . Number of children: 2  . Years of education: Not on file  . Highest education level: Associate degree: academic program  Occupational History    Comment: retired  Tobacco Use  . Smoking status: Former Smoker    Packs/day: 2.00  . Smokeless tobacco: Never Used    . Tobacco comment: quit 1960  Substance and Sexual Activity  . Alcohol use: No  . Drug use: No  . Sexual activity: Not on file  Other Topics Concern  . Not on file  Social History Narrative   09/20/19 lives alone, Janett Billow dgtr lives close by   Caffeine- < 1 daily   Social Determinants of Health   Financial Resource Strain:   . Difficulty of Paying Living Expenses: Not on file  Food Insecurity:   . Worried About Charity fundraiser in the Last Year: Not on file  . Ran Out of Food in the Last Year: Not on file  Transportation Needs:   . Lack of Transportation (Medical): Not on file  . Lack of Transportation (Non-Medical): Not on file  Physical Activity:   . Days of Exercise per Week: Not on file  . Minutes of Exercise per Session: Not on file  Stress:   . Feeling of Stress : Not on file  Social Connections:   . Frequency of Communication with Friends and Family: Not on file  . Frequency of Social Gatherings with Friends and Family: Not on file  . Attends Religious Services: Not on file  . Active Member of Clubs or Organizations: Not on file  . Attends Archivist Meetings: Not on file  . Marital Status: Not on file  Intimate Partner Violence:   . Fear of Current or Ex-Partner: Not on file  . Emotionally Abused: Not on file  . Physically Abused: Not on file  . Sexually Abused: Not on file     PHYSICAL EXAM  GENERAL EXAM/CONSTITUTIONAL: Vitals:  Vitals:   09/20/19 1407  BP: (!) 155/83  Pulse: 71  Temp: (!) 97.5 F (36.4 C)  Weight: 165 lb (74.8 kg)  Height: 5\' 4"  (1.626 m)     Body mass index is 28.32 kg/m. Wt Readings from Last 3 Encounters:  09/20/19 165 lb (74.8 kg)  05/26/19 145 lb (65.8 kg)  05/19/19 154 lb (69.9 kg)     Patient is in no distress; well developed, nourished and groomed; neck is supple  CARDIOVASCULAR:  Examination of carotid arteries is normal; no carotid bruits  Regular rate and rhythm, no murmurs  Examination of  peripheral vascular system by observation and palpation is normal  EYES:  Ophthalmoscopic exam of optic discs and posterior segments is normal; no papilledema or hemorrhages  No exam data present  MUSCULOSKELETAL:  Gait, strength, tone, movements noted in Neurologic exam below  NEUROLOGIC: MENTAL STATUS:  MMSE - Mini Mental State Exam 09/20/2019  Orientation to time 5  Orientation to Place 3  Registration 3  Attention/ Calculation 0  Recall 3  Language- name 2 objects 2  Language- repeat 0  Language- follow 3 step command 3  Language- read & follow direction 1  Write a  sentence 1  Copy design 0  Total score 21    awake, alert, oriented to person  [place] attention and concentration  language fluent, comprehension intact, naming intact  fund of knowledge appropriate  CRANIAL NERVE:   2nd - no papilledema on fundoscopic exam  2nd, 3rd, 4th, 6th - pupils equal and reactive to light, visual fields full to confrontation, extraocular muscles intact, no nystagmus  5th - facial sensation symmetric  7th - facial strength symmetric  8th - hearing intact  9th - palate elevates symmetrically, uvula midline  11th - shoulder shrug symmetric  12th - tongue protrusion midline  MOTOR:   normal bulk and tone, full strength in the BUE, BLE  SENSORY:   normal and symmetric to light touch, temperature, vibration  COORDINATION:   finger-nose-finger, fine finger movements normal  REFLEXES:   deep tendon reflexes TRACE and symmetric  GAIT/STATION:   narrow based gait     DIAGNOSTIC DATA (LABS, IMAGING, TESTING) - I reviewed patient records, labs, notes, testing and imaging myself where available.  Lab Results  Component Value Date   WBC 8.8 05/28/2019   HGB 9.6 (L) 05/28/2019   HCT 28.8 (L) 05/28/2019   MCV 91.7 05/28/2019   PLT 148 (L) 05/28/2019      Component Value Date/Time   NA 144 05/28/2019 0607   K 3.9 05/28/2019 0607   CL 113  (H) 05/28/2019 0607   CO2 22 05/28/2019 0607   GLUCOSE 103 (H) 05/28/2019 0607   BUN 15 05/28/2019 0607   CREATININE 1.19 (H) 05/28/2019 0607   CALCIUM 8.4 (L) 05/28/2019 0607   PROT 6.3 (L) 05/26/2019 1616   ALBUMIN 3.2 (L) 05/26/2019 1616   AST 14 (L) 05/26/2019 1616   ALT 9 05/26/2019 1616   ALKPHOS 103 05/26/2019 1616   BILITOT 0.8 05/26/2019 1616   GFRNONAA 43 (L) 05/28/2019 0607   GFRAA 50 (L) 05/28/2019 0607   No results found for: CHOL, HDL, LDLCALC, LDLDIRECT, TRIG, CHOLHDL No results found for: HGBA1C No results found for: VITAMINB12 No results found for: TSH   05/19/19 CT head  - normal    ASSESSMENT AND PLAN  84 y.o. year old female here with mild memory loss since 2020, with some changes in ADLs, forgetting medications, appointments, disorientation of day and night.  MMSE 2130.  Most like represents mild dementia.  Dx:  1. Mild dementia (HCC)      PLAN:  MEMORY LOSS (possible mild dementia; MMSE 21/30) - start memantine 10mg  at bedtime; increase to twice a day after 1-2 weeks - safety / supervision issues reviewed - caregiver resources provided - no driving; caution finances - consider clinical research study  Meds ordered this encounter  Medications  . memantine (NAMENDA) 10 MG tablet    Sig: Take 1 tablet (10 mg total) by mouth 2 (two) times daily.    Dispense:  60 tablet    Refill:  12   Return for return to PCP, pending if symptoms worsen or fail to improve.    Penni Bombard, MD [id number], 0000000 PM Certified in Neurology, Neurophysiology and Neuroimaging  [facility] [address] [place], [place] [address] [phone]

## 2019-09-20 NOTE — Patient Instructions (Signed)
MEMORY LOSS (possible mild dementia; MMSE 21/30) - start memantine 10mg  at bedtime; increase to twice a day after 1-2 weeks - safety / supervision issues reviewed - caregiver resources provided - no driving; caution finances

## 2019-11-04 ENCOUNTER — Ambulatory Visit: Payer: Medicare Other | Attending: Internal Medicine

## 2019-11-04 DIAGNOSIS — Z23 Encounter for immunization: Secondary | ICD-10-CM | POA: Insufficient documentation

## 2019-11-04 NOTE — Progress Notes (Signed)
   Covid-19 Vaccination Clinic  Name:  Abigail Shannon    MRN: [id number] DOB: 1935-10-29  11/04/2019  [name] was observed post Covid-19 immunization for 15 minutes without incidence. She was provided with Vaccine Information Sheet and instruction to access the V-Safe system.   [name] was instructed to call 911 with any severe reactions post vaccine: Marland Kitchen Difficulty breathing  . Swelling of your face and throat  . A fast heartbeat  . A bad rash all over your body  . Dizziness and weakness    Immunizations Administered    Name Date Dose VIS Date Route   Pfizer COVID-19 Vaccine 11/04/2019 12:37 PM 0.3 mL 08/19/2019 Intramuscular   Manufacturer: [place]   Lot: [id number]   [place]: [id number]

## 2019-11-30 ENCOUNTER — Ambulatory Visit: Payer: Medicare Other | Attending: Internal Medicine

## 2019-11-30 DIAGNOSIS — Z23 Encounter for immunization: Secondary | ICD-10-CM

## 2019-11-30 NOTE — Progress Notes (Signed)
   Covid-19 Vaccination Clinic  Name:  Abigail Shannon    MRN: [id number] DOB: 1936-06-26  11/30/2019  [name] was observed post Covid-19 immunization for 15 minutes without incident. She was provided with Vaccine Information Sheet and instruction to access the V-Safe system.   [name] was instructed to call 911 with any severe reactions post vaccine: Marland Kitchen Difficulty breathing  . Swelling of face and throat  . A fast heartbeat  . A bad rash all over body  . Dizziness and weakness   Immunizations Administered    Name Date Dose VIS Date Route   Pfizer COVID-19 Vaccine 11/30/2019 12:30 PM 0.3 mL 08/19/2019 Intramuscular   Manufacturer: [place]   Lot: [id number]   [place]: [id number]

## 2020-11-03 ENCOUNTER — Other Ambulatory Visit: Payer: Self-pay | Admitting: Diagnostic Neuroimaging

## 2021-02-06 DIAGNOSIS — E7801 Familial hypercholesterolemia: Secondary | ICD-10-CM | POA: Diagnosis not present

## 2021-02-06 DIAGNOSIS — L989 Disorder of the skin and subcutaneous tissue, unspecified: Secondary | ICD-10-CM | POA: Diagnosis not present

## 2021-02-06 DIAGNOSIS — I129 Hypertensive chronic kidney disease with stage 1 through stage 4 chronic kidney disease, or unspecified chronic kidney disease: Secondary | ICD-10-CM | POA: Diagnosis not present

## 2021-02-06 DIAGNOSIS — N1831 Chronic kidney disease, stage 3a: Secondary | ICD-10-CM | POA: Diagnosis not present

## 2021-02-19 DIAGNOSIS — D485 Neoplasm of uncertain behavior of skin: Secondary | ICD-10-CM | POA: Diagnosis not present

## 2021-02-19 DIAGNOSIS — L119 Acantholytic disorder, unspecified: Secondary | ICD-10-CM | POA: Diagnosis not present

## 2021-05-06 DIAGNOSIS — L309 Dermatitis, unspecified: Secondary | ICD-10-CM | POA: Diagnosis not present

## 2021-06-06 DIAGNOSIS — H52203 Unspecified astigmatism, bilateral: Secondary | ICD-10-CM | POA: Diagnosis not present

## 2021-08-06 DIAGNOSIS — E559 Vitamin D deficiency, unspecified: Secondary | ICD-10-CM | POA: Diagnosis not present

## 2021-08-06 DIAGNOSIS — E7801 Familial hypercholesterolemia: Secondary | ICD-10-CM | POA: Diagnosis not present

## 2021-08-06 DIAGNOSIS — I10 Essential (primary) hypertension: Secondary | ICD-10-CM | POA: Diagnosis not present

## 2021-08-13 DIAGNOSIS — R82998 Other abnormal findings in urine: Secondary | ICD-10-CM | POA: Diagnosis not present

## 2021-08-13 DIAGNOSIS — E7801 Familial hypercholesterolemia: Secondary | ICD-10-CM | POA: Diagnosis not present

## 2021-08-13 DIAGNOSIS — I129 Hypertensive chronic kidney disease with stage 1 through stage 4 chronic kidney disease, or unspecified chronic kidney disease: Secondary | ICD-10-CM | POA: Diagnosis not present

## 2021-08-13 DIAGNOSIS — Z0000 Encounter for general adult medical examination without abnormal findings: Secondary | ICD-10-CM | POA: Diagnosis not present

## 2021-08-13 DIAGNOSIS — N1831 Chronic kidney disease, stage 3a: Secondary | ICD-10-CM | POA: Diagnosis not present

## 2022-02-11 DIAGNOSIS — E7801 Familial hypercholesterolemia: Secondary | ICD-10-CM | POA: Diagnosis not present

## 2022-02-11 DIAGNOSIS — R809 Proteinuria, unspecified: Secondary | ICD-10-CM | POA: Diagnosis not present

## 2022-02-11 DIAGNOSIS — Z1331 Encounter for screening for depression: Secondary | ICD-10-CM | POA: Diagnosis not present

## 2022-02-11 DIAGNOSIS — Z1339 Encounter for screening examination for other mental health and behavioral disorders: Secondary | ICD-10-CM | POA: Diagnosis not present

## 2022-02-11 DIAGNOSIS — N1831 Chronic kidney disease, stage 3a: Secondary | ICD-10-CM | POA: Diagnosis not present

## 2022-02-11 DIAGNOSIS — I129 Hypertensive chronic kidney disease with stage 1 through stage 4 chronic kidney disease, or unspecified chronic kidney disease: Secondary | ICD-10-CM | POA: Diagnosis not present

## 2022-06-09 DIAGNOSIS — H524 Presbyopia: Secondary | ICD-10-CM | POA: Diagnosis not present

## 2022-08-11 DIAGNOSIS — R519 Headache, unspecified: Secondary | ICD-10-CM | POA: Diagnosis not present

## 2022-08-11 DIAGNOSIS — Z1152 Encounter for screening for COVID-19: Secondary | ICD-10-CM | POA: Diagnosis not present

## 2022-08-11 DIAGNOSIS — R051 Acute cough: Secondary | ICD-10-CM | POA: Diagnosis not present

## 2022-08-11 DIAGNOSIS — J0100 Acute maxillary sinusitis, unspecified: Secondary | ICD-10-CM | POA: Diagnosis not present

## 2022-08-15 DIAGNOSIS — E7801 Familial hypercholesterolemia: Secondary | ICD-10-CM | POA: Diagnosis not present

## 2022-08-15 DIAGNOSIS — I10 Essential (primary) hypertension: Secondary | ICD-10-CM | POA: Diagnosis not present

## 2022-08-15 DIAGNOSIS — E559 Vitamin D deficiency, unspecified: Secondary | ICD-10-CM | POA: Diagnosis not present

## 2022-08-20 DIAGNOSIS — E7801 Familial hypercholesterolemia: Secondary | ICD-10-CM | POA: Diagnosis not present

## 2022-08-22 DIAGNOSIS — I129 Hypertensive chronic kidney disease with stage 1 through stage 4 chronic kidney disease, or unspecified chronic kidney disease: Secondary | ICD-10-CM | POA: Diagnosis not present

## 2022-08-22 DIAGNOSIS — R82998 Other abnormal findings in urine: Secondary | ICD-10-CM | POA: Diagnosis not present

## 2022-08-22 DIAGNOSIS — N1831 Chronic kidney disease, stage 3a: Secondary | ICD-10-CM | POA: Diagnosis not present

## 2022-08-22 DIAGNOSIS — Z0000 Encounter for general adult medical examination without abnormal findings: Secondary | ICD-10-CM | POA: Diagnosis not present

## 2022-08-22 DIAGNOSIS — E7801 Familial hypercholesterolemia: Secondary | ICD-10-CM | POA: Diagnosis not present

## 2023-02-23 DIAGNOSIS — I129 Hypertensive chronic kidney disease with stage 1 through stage 4 chronic kidney disease, or unspecified chronic kidney disease: Secondary | ICD-10-CM | POA: Diagnosis not present

## 2023-02-23 DIAGNOSIS — N1831 Chronic kidney disease, stage 3a: Secondary | ICD-10-CM | POA: Diagnosis not present

## 2023-06-11 DIAGNOSIS — H52203 Unspecified astigmatism, bilateral: Secondary | ICD-10-CM | POA: Diagnosis not present

## 2023-08-20 DIAGNOSIS — I129 Hypertensive chronic kidney disease with stage 1 through stage 4 chronic kidney disease, or unspecified chronic kidney disease: Secondary | ICD-10-CM | POA: Diagnosis not present

## 2023-08-20 DIAGNOSIS — D631 Anemia in chronic kidney disease: Secondary | ICD-10-CM | POA: Diagnosis not present

## 2023-08-20 DIAGNOSIS — D649 Anemia, unspecified: Secondary | ICD-10-CM | POA: Diagnosis not present

## 2023-08-20 DIAGNOSIS — E559 Vitamin D deficiency, unspecified: Secondary | ICD-10-CM | POA: Diagnosis not present

## 2023-08-20 DIAGNOSIS — N1831 Chronic kidney disease, stage 3a: Secondary | ICD-10-CM | POA: Diagnosis not present

## 2023-08-20 DIAGNOSIS — R7989 Other specified abnormal findings of blood chemistry: Secondary | ICD-10-CM | POA: Diagnosis not present

## 2023-08-27 DIAGNOSIS — Z1331 Encounter for screening for depression: Secondary | ICD-10-CM | POA: Diagnosis not present

## 2023-08-27 DIAGNOSIS — I129 Hypertensive chronic kidney disease with stage 1 through stage 4 chronic kidney disease, or unspecified chronic kidney disease: Secondary | ICD-10-CM | POA: Diagnosis not present

## 2023-08-27 DIAGNOSIS — Z1339 Encounter for screening examination for other mental health and behavioral disorders: Secondary | ICD-10-CM | POA: Diagnosis not present

## 2023-08-27 DIAGNOSIS — R82998 Other abnormal findings in urine: Secondary | ICD-10-CM | POA: Diagnosis not present

## 2023-08-27 DIAGNOSIS — G309 Alzheimer's disease, unspecified: Secondary | ICD-10-CM | POA: Diagnosis not present

## 2023-08-27 DIAGNOSIS — Z0000 Encounter for general adult medical examination without abnormal findings: Secondary | ICD-10-CM | POA: Diagnosis not present
